# Patient Record
Sex: Male | Born: 1965
Health system: Southern US, Community
[De-identification: ages and names within clinical notes are randomized; demographics above are authoritative.]

## PROBLEM LIST (undated history)

## (undated) DIAGNOSIS — E7800 Pure hypercholesterolemia, unspecified: Secondary | ICD-10-CM

## (undated) DIAGNOSIS — N201 Calculus of ureter: Secondary | ICD-10-CM

## (undated) HISTORY — DX: Gilbert syndrome: E80.4

## (undated) HISTORY — PX: EYE SURGERY: SHX253

## (undated) HISTORY — DX: Calculus of ureter: N20.1

---

## 2002-01-07 ENCOUNTER — Emergency Department (HOSPITAL_COMMUNITY): Admission: EM | Admit: 2002-01-07 | Discharge: 2002-01-07 | Payer: Self-pay | Admitting: Emergency Medicine

## 2003-04-16 ENCOUNTER — Emergency Department (HOSPITAL_COMMUNITY): Admission: EM | Admit: 2003-04-16 | Discharge: 2003-04-16 | Payer: Self-pay | Admitting: Internal Medicine

## 2003-04-16 ENCOUNTER — Encounter: Payer: Self-pay | Admitting: Internal Medicine

## 2004-09-03 ENCOUNTER — Ambulatory Visit (HOSPITAL_COMMUNITY): Admission: RE | Admit: 2004-09-03 | Discharge: 2004-09-03 | Payer: Self-pay | Admitting: Pulmonary Disease

## 2004-09-24 ENCOUNTER — Ambulatory Visit: Payer: Self-pay | Admitting: Internal Medicine

## 2004-09-25 ENCOUNTER — Ambulatory Visit (HOSPITAL_COMMUNITY): Admission: RE | Admit: 2004-09-25 | Discharge: 2004-09-25 | Payer: Self-pay | Admitting: Internal Medicine

## 2004-10-02 ENCOUNTER — Ambulatory Visit (HOSPITAL_COMMUNITY): Admission: RE | Admit: 2004-10-02 | Discharge: 2004-10-02 | Payer: Self-pay | Admitting: Internal Medicine

## 2004-10-29 ENCOUNTER — Ambulatory Visit: Payer: Self-pay | Admitting: Internal Medicine

## 2005-09-02 ENCOUNTER — Ambulatory Visit: Payer: Self-pay | Admitting: Pulmonary Disease

## 2005-09-05 ENCOUNTER — Encounter (HOSPITAL_COMMUNITY): Admission: RE | Admit: 2005-09-05 | Discharge: 2005-09-05 | Payer: Self-pay | Admitting: Pulmonary Disease

## 2009-03-13 ENCOUNTER — Ambulatory Visit (HOSPITAL_COMMUNITY): Admission: RE | Admit: 2009-03-13 | Discharge: 2009-03-13 | Payer: Self-pay | Admitting: Pulmonary Disease

## 2010-10-26 ENCOUNTER — Encounter: Payer: Self-pay | Admitting: Pulmonary Disease

## 2012-10-23 ENCOUNTER — Emergency Department (HOSPITAL_COMMUNITY)
Admission: EM | Admit: 2012-10-23 | Discharge: 2012-10-23 | Disposition: A | Discharge status: Home / Self Care | Payer: 59 | Attending: Emergency Medicine | Admitting: Emergency Medicine

## 2012-10-23 ENCOUNTER — Encounter (HOSPITAL_COMMUNITY): Payer: Self-pay | Admitting: *Deleted

## 2012-10-23 DIAGNOSIS — E7800 Pure hypercholesterolemia, unspecified: Secondary | ICD-10-CM | POA: Insufficient documentation

## 2012-10-23 DIAGNOSIS — T2220XA Burn of second degree of shoulder and upper limb, except wrist and hand, unspecified site, initial encounter: Secondary | ICD-10-CM | POA: Insufficient documentation

## 2012-10-23 DIAGNOSIS — Z79899 Other long term (current) drug therapy: Secondary | ICD-10-CM | POA: Insufficient documentation

## 2012-10-23 DIAGNOSIS — Z23 Encounter for immunization: Secondary | ICD-10-CM | POA: Insufficient documentation

## 2012-10-23 DIAGNOSIS — Y9389 Activity, other specified: Secondary | ICD-10-CM | POA: Insufficient documentation

## 2012-10-23 DIAGNOSIS — T23259A Burn of second degree of unspecified palm, initial encounter: Secondary | ICD-10-CM | POA: Insufficient documentation

## 2012-10-23 DIAGNOSIS — X010XXA Exposure to flames in uncontrolled fire, not in building or structure, initial encounter: Secondary | ICD-10-CM | POA: Insufficient documentation

## 2012-10-23 DIAGNOSIS — T300 Burn of unspecified body region, unspecified degree: Secondary | ICD-10-CM

## 2012-10-23 DIAGNOSIS — T2121XA Burn of second degree of chest wall, initial encounter: Secondary | ICD-10-CM | POA: Insufficient documentation

## 2012-10-23 DIAGNOSIS — Y9289 Other specified places as the place of occurrence of the external cause: Secondary | ICD-10-CM | POA: Insufficient documentation

## 2012-10-23 DIAGNOSIS — X088XXA Exposure to other specified smoke, fire and flames, initial encounter: Secondary | ICD-10-CM | POA: Insufficient documentation

## 2012-10-23 HISTORY — DX: Pure hypercholesterolemia, unspecified: E78.00

## 2012-10-23 MED ORDER — OXYCODONE-ACETAMINOPHEN 5-325 MG PO TABS: 2.0000 | ORAL_TABLET | ORAL | Status: DC | PRN

## 2012-10-23 MED ORDER — HYDROMORPHONE HCL PF 1 MG/ML IJ SOLN
1.0000 mg | Freq: Once | INTRAMUSCULAR | Status: AC
  Administered 2012-10-23: 1 mg via INTRAVENOUS
  Filled 2012-10-23: qty 1

## 2012-10-23 MED ORDER — SILVER SULFADIAZINE 1 % EX CREA
TOPICAL_CREAM | Freq: Once | CUTANEOUS | Status: AC
  Administered 2012-10-23: 12:00:00 via TOPICAL

## 2012-10-23 MED ORDER — SILVER SULFADIAZINE 1 % EX CREA
TOPICAL_CREAM | CUTANEOUS | Status: AC
  Filled 2012-10-23: qty 100

## 2012-10-23 MED ORDER — TETANUS-DIPHTH-ACELL PERTUSSIS 5-2.5-18.5 LF-MCG/0.5 IM SUSP
0.5000 mL | Freq: Once | INTRAMUSCULAR | Status: AC
  Administered 2012-10-23: 0.5 mL via INTRAMUSCULAR
  Filled 2012-10-23: qty 0.5

## 2012-10-23 MED ORDER — SODIUM CHLORIDE 0.9 % IV SOLN
Freq: Once | INTRAVENOUS | Status: AC
  Administered 2012-10-23: 10:00:00 via INTRAVENOUS

## 2012-10-23 MED ORDER — SODIUM CHLORIDE 0.9 % IV BOLUS (SEPSIS)
1000.0000 mL | Freq: Once | INTRAVENOUS | Status: AC
  Administered 2012-10-23: 1000 mL via INTRAVENOUS

## 2012-10-23 MED ORDER — IBUPROFEN 600 MG PO TABS: 600.0000 mg | ORAL_TABLET | Freq: Four times a day (QID) | ORAL | Status: DC | PRN
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
## 2012-11-09 DIAGNOSIS — M792 Neuralgia and neuritis, unspecified: Secondary | ICD-10-CM | POA: Insufficient documentation

## 2012-11-09 DIAGNOSIS — L299 Pruritus, unspecified: Secondary | ICD-10-CM | POA: Insufficient documentation

## 2012-11-09 DIAGNOSIS — R52 Pain, unspecified: Secondary | ICD-10-CM | POA: Insufficient documentation

## 2012-11-27 ENCOUNTER — Encounter: Payer: Self-pay | Admitting: Family Medicine

## 2012-11-27 DIAGNOSIS — E785 Hyperlipidemia, unspecified: Secondary | ICD-10-CM | POA: Insufficient documentation

## 2013-01-03 ENCOUNTER — Encounter: Payer: Self-pay | Admitting: Physician Assistant

## 2013-01-03 ENCOUNTER — Ambulatory Visit (INDEPENDENT_AMBULATORY_CARE_PROVIDER_SITE_OTHER): Payer: 59 | Admitting: Physician Assistant

## 2013-01-03 VITALS — BP 126/84 | HR 56 | Temp 97.6°F | Resp 16 | Ht 71.0 in | Wt 151.0 lb

## 2013-01-03 DIAGNOSIS — E785 Hyperlipidemia, unspecified: Secondary | ICD-10-CM

## 2013-01-03 NOTE — Progress Notes (Signed)
   Patient ID: Devin Gross MRN: [id number], DOB: 07-16-1966, 47 y.o. Date of Encounter: 01/03/2013, 3:12 PM    Chief Complaint:  Chief Complaint  Patient presents with  . check cholesterol and PSA     HPI: 47 y.o. year old male here to f/u hyperlipidemia. He has a positive family h/o CAD. His father had CABG @ Age 22. His first OV here was 4/13.  Prior to coming here, was on Lipitor 10mg .  When came here 4/13, had FLP- LDL was 181- Lipitor was increased to 40mg . He has had f/u labs since then. Is still on Lipitor 40mg . He is taking this daily. Has no myalgias; no other adverse effects.  No complaints today. Has fasted all day!     Home Meds: Current Outpatient Prescriptions on File Prior to Visit  Medication Sig Dispense Refill  . atorvastatin (LIPITOR) 40 MG tablet Take 40 mg by mouth daily.      .       .            Allergies: No Known Allergies    Review of Systems: Constitutional: negative for chills, fever, night sweats, weight changes, or fatigue  HEENT: negative for vision changes, hearing loss, congestion, rhinorrhea, ST, epistaxis, or sinus pressure Cardiovascular: negative for chest pain or palpitations Respiratory: negative for hemoptysis, wheezing, shortness of breath, or cough Abdominal: negative for abdominal pain, nausea, vomiting, diarrhea, or constipation Dermatological: negative for rash Neurologic: negative for headache, dizziness, or syncope    Physical Exam: Blood pressure 126/84, pulse 56, temperature 97.6 F (36.4 C), temperature source Oral, resp. rate 16, height 5\' 11"  (1.803 m), weight 151 lb (68.493 kg)., Body mass index is 21.07 kg/(m^2). General: Well developed, well nourished,AAM. in no acute distress. Neck: Supple. No thyromegaly. Full ROM. No lymphadenopathy. Lungs: Clear bilaterally to auscultation without wheezes, rales, or rhonchi. Breathing is unlabored. Heart: RRR with S1 S2. No murmurs, rubs, or gallops  appreciated. Abdomen: Soft, non-tender, non-distended with normoactive bowel sounds. No hepatomegaly. No rebound/guarding. No obvious abdominal masses. Msk:  Strength and tone normal for age. Extremities/Skin: Warm and dry. No clubbing or cyanosis. No edema. No rashes or suspicious lesions. Neuro: Alert and oriented X 3. Moves all extremities spontaneously. Gait is normal. CNII-XII grossly in tact. Psych:  Responds to questions appropriately with a normal affect.     ASSESSMENT AND PLAN:  47 y.o. year old male with  1. Hyperlipidemia On Lipitor 40mg  QD. Check labs then adjust med if needed. - Lipid panel - COMPLETE METABOLIC PANEL WITH GFR  2. Gilbert's syndrome  3. Premature Family H/O CAD--Father with CABG at age 7 y/o.  4. Screening PSA- Discussed that insurance will only pay for this once q 12 mos. Last check 01/19/12. Will repeat p 01/18/13. He had CPE 01/19/12-screening labs and exam were normal.   Signed, [address] [place], [place], [facility] 01/03/2013 3:12 PM

## 2013-01-04 LAB — COMPLETE METABOLIC PANEL WITH GFR
AST: 19 U/L (ref 0–37)
Albumin: 4.5 g/dL (ref 3.5–5.2)
Alkaline Phosphatase: 57 U/L (ref 39–117)
BUN: 12 mg/dL (ref 6–23)
GFR, Est African American: >89 mL/min
GFR, Est Non African American: >89 mL/min
Glucose, Bld: 80 mg/dL (ref 70–99)
Potassium: 4.3 mEq/L (ref 3.5–5.3)
Sodium: 142 mEq/L (ref 135–145)
Total Bilirubin: 2.2 mg/dL — ABNORMAL HIGH (ref 0.3–1.2)
Total Protein: 7 g/dL (ref 6.0–8.3)

## 2013-01-04 LAB — LIPID PANEL
Cholesterol: 136 mg/dL (ref 0–200)
HDL: 52 mg/dL (ref >39)
LDL Cholesterol: 71 mg/dL (ref 0–99)
Total CHOL/HDL Ratio: 2.6 Ratio
Triglycerides: 64 mg/dL (ref <150)
VLDL: 13 mg/dL (ref 0–40)

## 2013-01-05 ENCOUNTER — Telehealth: Payer: Self-pay | Admitting: Family Medicine

## 2013-01-05 NOTE — Telephone Encounter (Signed)
Called pt.  Left mess all labs excellent.  Continue current medications and follow up again in 

## 2013-01-05 NOTE — Telephone Encounter (Signed)
Message copied by Donne Anon on Wed Jan 05, 2013  9:24 AM ------      Message from: Allayne Butcher      Created: Tue Jan 04, 2013 10:14 AM       Cholesterol numbers are excellent.   Liver numbers normal . . Continue current medication. Recheck 6 months ------

## 2013-02-08 DIAGNOSIS — L905 Scar conditions and fibrosis of skin: Secondary | ICD-10-CM | POA: Insufficient documentation

## 2013-04-24 ENCOUNTER — Other Ambulatory Visit: Payer: Self-pay | Admitting: Physician Assistant

## 2013-04-25 NOTE — Telephone Encounter (Signed)
Med refilled.

## 2013-07-19 ENCOUNTER — Emergency Department (HOSPITAL_COMMUNITY)
Admission: EM | Admit: 2013-07-19 | Discharge: 2013-07-20 | Disposition: A | Discharge status: Home / Self Care | Payer: 59 | Attending: Emergency Medicine | Admitting: Emergency Medicine

## 2013-07-19 ENCOUNTER — Encounter (HOSPITAL_COMMUNITY): Payer: Self-pay | Admitting: Emergency Medicine

## 2013-07-19 DIAGNOSIS — E7800 Pure hypercholesterolemia, unspecified: Secondary | ICD-10-CM | POA: Insufficient documentation

## 2013-07-19 DIAGNOSIS — K5900 Constipation, unspecified: Secondary | ICD-10-CM | POA: Insufficient documentation

## 2013-07-19 DIAGNOSIS — N200 Calculus of kidney: Secondary | ICD-10-CM

## 2013-07-19 LAB — URINALYSIS, ROUTINE W REFLEX MICROSCOPIC
Bilirubin Urine: NEGATIVE
Glucose, UA: NEGATIVE mg/dL
Leukocytes, UA: NEGATIVE
Nitrite: NEGATIVE
Specific Gravity, Urine: 1.02 (ref 1.005–1.030)
pH: 7 (ref 5.0–8.0)

## 2013-07-19 LAB — URINE MICROSCOPIC-ADD ON

## 2013-07-19 MED ORDER — HYDROMORPHONE HCL PF 1 MG/ML IJ SOLN
0.5000 mg | Freq: Once | INTRAMUSCULAR | Status: AC
  Administered 2013-07-20: 0.5 mg via INTRAVENOUS
  Filled 2013-07-19: qty 1

## 2013-07-19 MED ORDER — ONDANSETRON HCL 4 MG/2ML IJ SOLN
4.0000 mg | Freq: Once | INTRAMUSCULAR | Status: AC
  Administered 2013-07-20: 4 mg via INTRAVENOUS
  Filled 2013-07-19: qty 2

## 2013-07-19 NOTE — ED Provider Notes (Signed)
CSN: [id number]     Arrival date & time 07/19/13  2257 History   First MD Initiated Contact with Patient 07/19/13 2259     Chief Complaint  Patient presents with  . Abdominal Pain   (Consider location/radiation/quality/duration/timing/severity/associated sxs/prior Treatment) HPI Comments: Pt ia a 47 y/o male who present to ED with c/o constipation and changes in the urine. Pt states for 3 weeks he has been having changes in the stool. He reports the stool is brown but not black. No reported fever. No injury. He reports pain to the right side and going across to the left side. He had one episode of vomiting just before coming to the ED.  Pt also reports his urine is pink  To red in color. No c/o pain in the penis or testicles. No back pain.   Patient is a 47 y.o. male presenting with abdominal pain. The history is provided by the patient.  Abdominal Pain Associated symptoms: constipation   Associated symptoms: no chest pain, no cough, no dysuria, no hematuria and no shortness of breath     Past Medical History  Diagnosis Date  . Hypercholesteremia   . Gilbert's syndrome    Past Surgical History  Procedure Laterality Date  . Eye surgery     Family History  Problem Relation Age of Onset  . Heart disease Father 48    CABG no prior hx  . Hypertension Brother   . Hypertension Maternal Grandmother    History  Substance Use Topics  . Smoking status: Never Smoker   . Smokeless tobacco: Never Used  . Alcohol Use: No    Review of Systems  Constitutional: Negative for activity change.       All ROS Neg except as noted in HPI  HENT: Negative for nosebleeds.   Eyes: Negative for photophobia and discharge.  Respiratory: Negative for cough, shortness of breath and wheezing.   Cardiovascular: Negative for chest pain and palpitations.  Gastrointestinal: Positive for abdominal pain and constipation. Negative for blood in stool.  Genitourinary: Positive for flank pain. Negative for  dysuria, frequency and hematuria.  Musculoskeletal: Negative for arthralgias, back pain and neck pain.  Skin: Negative.   Neurological: Negative for dizziness, seizures and speech difficulty.  Psychiatric/Behavioral: Negative for hallucinations and confusion.    Allergies  Review of patient's allergies indicates no known allergies.  Home Medications   Current Outpatient Rx  Name  Route  Sig  Dispense  Refill  . atorvastatin (LIPITOR) 40 MG tablet      TAKE 1 TABLET BY MOUTH AT BEDTIME.   30 tablet   2     Dispense as written.   Marland Kitchen ibuprofen (ADVIL,MOTRIN) 600 MG tablet   Oral   Take 1 tablet (600 mg total) by mouth every 6 (six) hours as needed for pain.   30 tablet   0   . oxyCODONE-acetaminophen (PERCOCET/ROXICET) 5-325 MG per tablet   Oral   Take 2 tablets by mouth every 4 (four) hours as needed for pain.   15 tablet   0    BP 141/81  Pulse 56  Temp(Src) 98.4 F (36.9 C) (Oral)  Resp 20  Ht 6\' 1"  (1.854 m)  Wt 150 lb (68.04 kg)  BMI 19.79 kg/m2  SpO2 100% Physical Exam  Nursing note and vitals reviewed. Constitutional: He is oriented to person, place, and time. He appears well-developed and well-nourished.  Non-toxic appearance.  HENT:  Head: Normocephalic.  Right Ear: Tympanic membrane and external ear  normal.  Left Ear: Tympanic membrane and external ear normal.  Eyes: EOM and lids are normal. Pupils are equal, round, and reactive to light.  Neck: Normal range of motion. Neck supple. Carotid bruit is not present.  Cardiovascular: Normal rate, regular rhythm, normal heart sounds, intact distal pulses and normal pulses.   Pulmonary/Chest: Breath sounds normal. No respiratory distress.  Abdominal: Soft. Bowel sounds are normal. There is no tenderness. There is no guarding.  Genitourinary: Rectum normal. Guaiac negative stool.  Musculoskeletal: Normal range of motion.  Lymphadenopathy:       Head (right side): No submandibular adenopathy present.       Head  (left side): No submandibular adenopathy present.    He has no cervical adenopathy.  Neurological: He is alert and oriented to person, place, and time. He has normal strength. No cranial nerve deficit or sensory deficit.  Skin: Skin is warm and dry.  Psychiatric: He has a normal mood and affect. His speech is normal.    ED Course  Procedures (including critical care time) Labs Review Labs Reviewed  URINALYSIS, ROUTINE W REFLEX MICROSCOPIC   Imaging Review No results found.  EKG Interpretation   None       MDM  No diagnosis found. **I have reviewed nursing notes, vital signs, and all appropriate lab and imaging results for this patient.*  Vital signs are within normal limits with the exception of the pulse being 56. Pain much improved after intravenous Dilaudid.  The complete blood count is well within normal limits. The basic metabolic panel shows potassium to be low at 3.3 otherwise within normal limits. The hepatic function study shows the total bilirubin to be slightly elevated at 1.4 the indirect bilirubin to be elevated at 1.2. Lipase is normal at 38. Urinalysis shows a brown cloudy specimen with key tones present a large amount of hemoglobin and too numerous to count red blood cells being present. A CT scan of the abdomen and pelvis without contrast reveals moderate right hydronephrosis and hydroureter secondary to a 3 mm proximal ureteral stone present. There is also moderate food and fluid in the stomach. The prostate gland seems to be unremarkable. There is a moderate stool burden present.  Patient is advised to increase brain and in leafy green vegetables and water in his diet. He is advised to see Dr. Darrick Penna for additional evaluation of the moderate food and fluid in the stomach. The patient is to see [facility] concerning his kidney stones. He is advised to strain all urine and to have any stones that he pass evaluated by the urology specialist. Prescription  for diclofenac and Percocet given for pain. Patient is to return if any problems or concerns.  Kathie Dike, PA-C 07/20/13 [phone]

## 2013-07-19 NOTE — ED Notes (Signed)
Urine cherry red, sent to lab
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
## 2013-07-20 ENCOUNTER — Emergency Department (HOSPITAL_COMMUNITY): Payer: 59

## 2013-07-20 LAB — CBC WITH DIFFERENTIAL/PLATELET
Basophils Absolute: 0 10*3/uL (ref 0.0–0.1)
Eosinophils Absolute: 0.1 10*3/uL (ref 0.0–0.7)
Eosinophils Relative: 1 % (ref 0–5)
HCT: 41.9 % (ref 39.0–52.0)
Hemoglobin: 13.6 g/dL (ref 13.0–17.0)
Lymphocytes Relative: 20 % (ref 12–46)
Lymphs Abs: 1.7 10*3/uL (ref 0.7–4.0)
MCH: 28.2 pg (ref 26.0–34.0)
MCHC: 32.5 g/dL (ref 30.0–36.0)
Monocytes Absolute: 0.6 10*3/uL (ref 0.1–1.0)
Monocytes Relative: 7 % (ref 3–12)
Neutro Abs: 6.4 10*3/uL (ref 1.7–7.7)
Neutrophils Relative %: 72 % (ref 43–77)
Platelets: 209 10*3/uL (ref 150–400)
RBC: 4.83 MIL/uL (ref 4.22–5.81)
RDW: 13.7 % (ref 11.5–15.5)
WBC: 8.7 10*3/uL (ref 4.0–10.5)

## 2013-07-20 LAB — BASIC METABOLIC PANEL
BUN: 10 mg/dL (ref 6–23)
CO2: 30 mEq/L (ref 19–32)
Calcium: 9.6 mg/dL (ref 8.4–10.5)
Chloride: 103 mEq/L (ref 96–112)
Creatinine, Ser: 1.04 mg/dL (ref 0.50–1.35)
GFR calc Af Amer: >90 mL/min (ref >90)
GFR calc non Af Amer: 85 mL/min — ABNORMAL LOW (ref >90)
Glucose, Bld: 118 mg/dL — ABNORMAL HIGH (ref 70–99)
Potassium: 3.3 mEq/L — ABNORMAL LOW (ref 3.5–5.1)
Sodium: 143 mEq/L (ref 135–145)

## 2013-07-20 LAB — HEPATIC FUNCTION PANEL
ALT: 24 U/L (ref 0–53)
AST: 20 U/L (ref 0–37)
Albumin: 4.5 g/dL (ref 3.5–5.2)
Alkaline Phosphatase: 61 U/L (ref 39–117)
Bilirubin, Direct: 0.2 mg/dL (ref 0.0–0.3)
Indirect Bilirubin: 1.2 mg/dL — ABNORMAL HIGH (ref 0.3–0.9)
Total Bilirubin: 1.4 mg/dL — ABNORMAL HIGH (ref 0.3–1.2)
Total Protein: 7.5 g/dL (ref 6.0–8.3)

## 2013-07-20 MED ORDER — ACETAMINOPHEN 500 MG PO TABS: 1000.0000 mg | ORAL_TABLET | Freq: Once | ORAL | Status: DC

## 2013-07-20 MED ORDER — OXYCODONE-ACETAMINOPHEN 5-325 MG PO TABS
1.0000 | ORAL_TABLET | ORAL | Status: DC | PRN
Start: 2013-07-20 — End: 2013-11-10

## 2013-07-20 MED ORDER — DICLOFENAC SODIUM 75 MG PO TBEC: 75.0000 mg | DELAYED_RELEASE_TABLET | Freq: Two times a day (BID) | ORAL | Status: DC

## 2013-07-20 MED ORDER — SODIUM CHLORIDE 0.9 % IV BOLUS (SEPSIS): 1000.0000 mL | Freq: Once | INTRAVENOUS | Status: DC

## 2013-07-20 MED ORDER — POTASSIUM CHLORIDE CRYS ER 20 MEQ PO TBCR
40.0000 meq | EXTENDED_RELEASE_TABLET | Freq: Once | ORAL | Status: AC
  Administered 2013-07-20: 40 meq via ORAL
  Filled 2013-07-20: qty 2

## 2013-07-20 MED ORDER — MAGNESIUM HYDROXIDE 400 MG/5ML PO SUSP
30.0000 mL | Freq: Once | ORAL | Status: AC
  Administered 2013-07-20: 30 mL via ORAL
  Filled 2013-07-20: qty 30

## 2013-07-20 MED ORDER — OXYCODONE-ACETAMINOPHEN 5-325 MG PO TABS: 1.0000 | ORAL_TABLET | Freq: Four times a day (QID) | ORAL | Status: DC | PRN

## 2013-07-20 NOTE — ED Provider Notes (Signed)
Medical screening examination/treatment/procedure(s) were performed by non-physician practitioner and as supervising physician I was immediately available for consultation/collaboration.  Ashling Roane, MD 07/20/13 2117 

## 2013-07-27 MED FILL — Oxycodone w/ Acetaminophen Tab 5-325 MG: ORAL | Qty: 6 | Status: AC

## 2013-08-24 ENCOUNTER — Ambulatory Visit: Payer: 59 | Admitting: Physician Assistant

## 2013-08-31 ENCOUNTER — Encounter: Payer: Self-pay | Admitting: Family Medicine

## 2013-08-31 DIAGNOSIS — N201 Calculus of ureter: Secondary | ICD-10-CM | POA: Insufficient documentation

## 2013-08-31 HISTORY — DX: Calculus of ureter: N20.1

## 2013-09-25 ENCOUNTER — Other Ambulatory Visit: Payer: Self-pay | Admitting: Physician Assistant

## 2013-11-04 ENCOUNTER — Other Ambulatory Visit: Payer: BC Managed Care – PPO

## 2013-11-04 DIAGNOSIS — E785 Hyperlipidemia, unspecified: Secondary | ICD-10-CM

## 2013-11-04 DIAGNOSIS — Z0000 Encounter for general adult medical examination without abnormal findings: Secondary | ICD-10-CM

## 2013-11-04 DIAGNOSIS — Z79899 Other long term (current) drug therapy: Secondary | ICD-10-CM

## 2013-11-04 LAB — CBC WITH DIFFERENTIAL/PLATELET
BASOS PCT: 0 % (ref 0–1)
Basophils Absolute: 0 10*3/uL (ref 0.0–0.1)
Eosinophils Absolute: 0 10*3/uL (ref 0.0–0.7)
Eosinophils Relative: 1 % (ref 0–5)
HEMATOCRIT: 38.9 % — AB (ref 39.0–52.0)
HEMOGLOBIN: 12.9 g/dL — AB (ref 13.0–17.0)
LYMPHS ABS: 1.4 10*3/uL (ref 0.7–4.0)
Lymphocytes Relative: 36 % (ref 12–46)
MCH: 27.2 pg (ref 26.0–34.0)
MCHC: 33.2 g/dL (ref 30.0–36.0)
MCV: 82.1 fL (ref 78.0–100.0)
MONO ABS: 0.4 10*3/uL (ref 0.1–1.0)
MONOS PCT: 9 % (ref 3–12)
NEUTROS PCT: 54 % (ref 43–77)
Neutro Abs: 2.1 10*3/uL (ref 1.7–7.7)
Platelets: 218 10*3/uL (ref 150–400)
RBC: 4.74 MIL/uL (ref 4.22–5.81)
RDW: 13.8 % (ref 11.5–15.5)
WBC: 3.9 10*3/uL — ABNORMAL LOW (ref 4.0–10.5)

## 2013-11-04 LAB — COMPLETE METABOLIC PANEL WITH GFR
ALK PHOS: 54 U/L (ref 39–117)
ALT: 17 U/L (ref 0–53)
AST: 20 U/L (ref 0–37)
Albumin: 4.6 g/dL (ref 3.5–5.2)
BUN: 9 mg/dL (ref 6–23)
CHLORIDE: 105 meq/L (ref 96–112)
CO2: 30 mEq/L (ref 19–32)
CREATININE: 0.79 mg/dL (ref 0.50–1.35)
Calcium: 9.1 mg/dL (ref 8.4–10.5)
GFR, Est African American: >89 mL/min
GFR, Est Non African American: >89 mL/min
Glucose, Bld: 77 mg/dL (ref 70–99)
Potassium: 3.7 mEq/L (ref 3.5–5.3)
Sodium: 142 mEq/L (ref 135–145)
Total Bilirubin: 2.3 mg/dL — ABNORMAL HIGH (ref 0.2–1.2)
Total Protein: 7 g/dL (ref 6.0–8.3)

## 2013-11-04 LAB — LIPID PANEL
CHOL/HDL RATIO: 2.5 ratio
CHOLESTEROL: 140 mg/dL (ref 0–200)
HDL: 57 mg/dL (ref >39)
LDL CALC: 71 mg/dL (ref 0–99)
Triglycerides: 59 mg/dL (ref <150)
VLDL: 12 mg/dL (ref 0–40)

## 2013-11-10 ENCOUNTER — Encounter: Payer: Self-pay | Admitting: Physician Assistant

## 2013-11-10 ENCOUNTER — Ambulatory Visit (INDEPENDENT_AMBULATORY_CARE_PROVIDER_SITE_OTHER): Payer: BC Managed Care – PPO | Admitting: Physician Assistant

## 2013-11-10 DIAGNOSIS — Z8249 Family history of ischemic heart disease and other diseases of the circulatory system: Secondary | ICD-10-CM

## 2013-11-10 DIAGNOSIS — E785 Hyperlipidemia, unspecified: Secondary | ICD-10-CM

## 2013-11-10 MED ORDER — ATORVASTATIN CALCIUM 20 MG PO TABS: 20.0000 mg | ORAL_TABLET | Freq: Every day | ORAL | Status: DC

## 2013-11-11 NOTE — Progress Notes (Signed)
Patient ID: Devin Gross MRN: [id number], DOB: [birth year], 48 y.o. Date of Encounter: 11/11/2013, 1:16 PM    Chief Complaint:  Chief Complaint  Patient presents with  . post lab follow up     HPI: 48 y.o. year old [place] male here for routine followup office visit is hyperlipidemia. He has a family history of premature CAD. Father had CABG at age 64. He has no history of hypertension and has never smoked. However he does have hyperlipidemia which has been managed with medications to reduce his risk of CAD.  He is currently taking Lipitor 40 mg. He is having no myalgias or other adverse effects. However, he is asking today about possibly reducing the dose. He says he has heard these medications can cause problems, including to the liver. Feels that 40 mg is a significant dose and is wondering about reducing this.  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 As the evening with this physical exertion he has no chest pressure heaviness tightness and no increased shortness of breath or dyspnea on exertion.  Reports that he lived in [place] a while. Then in 2010/12/31 his father passed away he  he moved to [place] and has been living with his mom there since. Lives with his mom and it is just the 2 of them.     Home Meds: See attached medication section for any medications that were entered at today's visit. The computer does not put those onto this list.The following list is a list of meds entered prior to today's visit.  He is on Lipitor 40 mg 1 daily Current Outpatient Prescriptions on File Prior to Visit  Medication Sig Dispense Refill  . ibuprofen (ADVIL,MOTRIN) 600 MG tablet Take 1 tablet (600 mg total) by mouth every 6 (six) hours as needed for pain.  30 tablet  0   No current facility-administered medications on file prior to visit.    Allergies: No Known Allergies    Review of Systems: See HPI for pertinent ROS. All other ROS negative.    Physical  Exam: Blood pressure 124/82, pulse 56, temperature 98.1 F (36.7 C), temperature source Oral, resp. rate 18, height 6' (1.829 m), weight 152 lb (68.947 kg)., Body mass index is 20.61 kg/(m^2). General: NWD AAM. Appears in no acute distress. Neck: Supple. No thyromegaly. No lymphadenopathy. No carotid bruits. Lungs: Clear bilaterally to auscultation without wheezes, rales, or rhonchi. Breathing is unlabored. Heart: Regular rhythm. No murmurs, rubs, or gallops. Abdomen: Soft, non-tender, non-distended with normoactive bowel sounds. No hepatomegaly. No rebound/guarding. No obvious abdominal masses. Msk:  Strength and tone normal for age. Extremities/Skin: Warm and dry. Neuro: Alert and oriented X 3. Moves all extremities spontaneously. Gait is normal. CNII-XII grossly in tact. Psych:  Responds to questions appropriately with a normal affect.   Results for orders placed in visit on 11/04/13  CBC WITH DIFFERENTIAL      Result Value Range   WBC 3.9 (*) 4.0 - 10.5 K/uL   RBC 4.74  4.22 - 5.81 MIL/uL   Hemoglobin 12.9 (*) 13.0 - 17.0 g/dL   HCT 38.9 (*) 39.0 - 52.0 %   MCV 82.1  78.0 - 100.0 fL   MCH 27.2  26.0 - 34.0 pg   MCHC 33.2  30.0 - 36.0 g/dL   RDW 13.8  11.5 - 15.5 %   Platelets 218  150 - 400 K/uL   Neutrophils Relative % 54  43 - 77 %   Neutro  Abs 2.1  1.7 - 7.7 K/uL   Lymphocytes Relative 36  12 - 46 %   Lymphs Abs 1.4  0.7 - 4.0 K/uL   Monocytes Relative 9  3 - 12 %   Monocytes Absolute 0.4  0.1 - 1.0 K/uL   Eosinophils Relative 1  0 - 5 %   Eosinophils Absolute 0.0  0.0 - 0.7 K/uL   Basophils Relative 0  0 - 1 %   Basophils Absolute 0.0  0.0 - 0.1 K/uL   Smear Review Criteria for review not met    LIPID PANEL      Result Value Range   Cholesterol 140  0 - 200 mg/dL   Triglycerides 59  <150 mg/dL   HDL 57  >39 mg/dL   Total CHOL/HDL Ratio 2.5     VLDL 12  0 - 40 mg/dL   LDL Cholesterol 71  0 - 99 mg/dL  COMPLETE METABOLIC PANEL WITH GFR      Result Value Range    Sodium 142  135 - 145 mEq/L   Potassium 3.7  3.5 - 5.3 mEq/L   Chloride 105  96 - 112 mEq/L   CO2 30  19 - 32 mEq/L   Glucose, Bld 77  70 - 99 mg/dL   BUN 9  6 - 23 mg/dL   Creat 0.79  0.50 - 1.35 mg/dL   Total Bilirubin 2.3 (*) 0.2 - 1.2 mg/dL   Alkaline Phosphatase 54  39 - 117 U/L   AST 20  0 - 37 U/L   ALT 17  0 - 53 U/L   Total Protein 7.0  6.0 - 8.3 g/dL   Albumin 4.6  3.5 - 5.2 g/dL   Calcium 9.1  8.4 - 10.5 mg/dL   GFR, Est African American >89     GFR, Est Non African American >89       ASSESSMENT AND PLAN:  48 y.o. year old male with  1. Hyperlipidemia Lipid panel is excellent as above. Last labs prior to this for 10 months ago and the numbers were almost exactly the same. Both times LDL was 71. I agree that we can decrease his Lipitor dose down from 40 mg down to 20 mg. Will need followup office visit and fasting labs in 6 months. - atorvastatin (LIPITOR) 20 MG tablet; Take 1 tablet (20 mg total) by mouth daily.  Dispense: 90 tablet; Refill: 1  2. Family history of premature coronary artery disease Father with CABG at age 42--first diagnosed CAD.  3. Gilbert's syndrome    Signed, [address] [place], [place], [facility] 11/11/2013 1:16 PM

## 2014-05-10 ENCOUNTER — Encounter: Payer: Self-pay | Admitting: Physician Assistant

## 2014-05-10 ENCOUNTER — Ambulatory Visit (INDEPENDENT_AMBULATORY_CARE_PROVIDER_SITE_OTHER): Payer: BC Managed Care – PPO | Admitting: Physician Assistant

## 2014-05-10 VITALS — BP 104/70 | HR 64 | Temp 98.0°F | Resp 14 | Ht 73.0 in | Wt 158.0 lb

## 2014-05-10 DIAGNOSIS — Z8249 Family history of ischemic heart disease and other diseases of the circulatory system: Secondary | ICD-10-CM

## 2014-05-10 DIAGNOSIS — E785 Hyperlipidemia, unspecified: Secondary | ICD-10-CM

## 2014-05-10 DIAGNOSIS — B354 Tinea corporis: Secondary | ICD-10-CM

## 2014-05-10 NOTE — Progress Notes (Signed)
Patient ID: Devin Gross MRN: [id number], DOB: 1966-06-25, 48 y.o. Date of Encounter: 05/10/2014, 4:30 PM    Chief Complaint:  Chief Complaint  Patient presents with  . Med check     HPI: 48 y.o. year old AA male here for routine followup office visit is hyperlipidemia. He has a family history of premature CAD. Father had CABG at age 24. He has no history of hypertension and has never smoked. However he does have hyperlipidemia which has been managed with medications to reduce his risk of CAD.  He had been taking Lipitor 40mg . At his OV 11/10/2013, he asked about possibly reducing the dose. He said he has heard these medications can cause problems, including to the liver. Felt that 40 mg was a significant dose and was wondering about reducing this.  At that OV I decreased the dose from 40mg  to 20mg .   he is taking his Lipitor to 20 mg. He continues to have no myalgias or other adverse effects.  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 Says that even with this physical exertion he has no chest pressure heaviness tightness and no increased shortness of breath or dyspnea on exertion.  Reports that he lived in [place] a while. Then in 01-23-11 his father passed away he  he moved to [place] and has been living with his mom there since. Lives with his mom and it is just the 2 of them.  The only other thing he wanted to check on today is that he has been having a rash recently. Has noticed an area on his right chest and also behind his left ear.     Home Meds:  Outpatient Prescriptions Prior to Visit  Medication Sig Dispense Refill  . atorvastatin (LIPITOR) 20 MG tablet Take 1 tablet (20 mg total) by mouth daily.  90 tablet  1  . ibuprofen (ADVIL,MOTRIN) 600 MG tablet Take 1 tablet (600 mg total) by mouth every 6 (six) hours as needed for pain.  30 tablet  0   No facility-administered medications prior to visit.     Allergies: No Known Allergies     Review of Systems: See HPI for pertinent ROS. All other ROS negative.    Physical Exam: Blood pressure 104/70, pulse 64, temperature 98 F (36.7 C), temperature source Oral, resp. rate 14, height 6\' 1"  (1.854 m), weight 158 lb (71.668 kg)., Body mass index is 20.85 kg/(m^2). General: NWD AAM. Appears in no acute distress. Neck: Supple. No thyromegaly. No lymphadenopathy. No carotid bruits. Lungs: Clear bilaterally to auscultation without wheezes, rales, or rhonchi. Breathing is unlabored. Heart: Regular rhythm. No murmurs, rubs, or gallops. Abdomen: Soft, non-tender, non-distended with normoactive bowel sounds. No hepatomegaly. No rebound/guarding. No obvious abdominal masses. Msk:  Strength and tone normal for age. Extremities/Skin: Warm and dry. Right Chest: Round/circular rash, approx 2 inch diameter with raised pink/erythematous border.  Approx 2 cm diameter area of rash just behind the left ear. Neuro: Alert and oriented X 3. Moves all extremities spontaneously. Gait is normal. CNII-XII grossly in tact. Psych:  Responds to questions appropriately with a normal affect.      ASSESSMENT AND PLAN:  48 y.o. year old male with     Tinea corporis Told him to use OTC Lamisil or Lotrimin. Apply BID every day -- if not improved in 2 -3 weeks, f/u.  1. Hyperlipidemia - Hepatic function panel He is not fasting today. Will go ahead and check liver function tests  now. He says that he goes in to work very early prior to [employer]. Has no days off schedule anywhere in the near future. However, he says that if he has a day that it rains, then he will not be able to work--if this happens any time then he will come in and do fasting lipid panel at that time. Lipid panel was excellent at last check 11/04/2013. However at his office visit 11/10/13 we decreased his Lipitor from 40 mg to 20 mg. We have not rechecked lipids on decreased dose.   2. Family history of premature coronary artery  disease Father with CABG at age 45--first diagnosed CAD.  3. Gilbert's syndrome    Signed, [address] [place], [place], [facility] 05/10/2014 4:30 PM

## 2014-05-11 ENCOUNTER — Encounter: Payer: Self-pay | Admitting: *Deleted

## 2014-05-11 LAB — HEPATIC FUNCTION PANEL
ALK PHOS: 54 U/L (ref 39–117)
ALT: 15 U/L (ref 0–53)
AST: 17 U/L (ref 0–37)
Albumin: 4.9 g/dL (ref 3.5–5.2)
BILIRUBIN DIRECT: 0.3 mg/dL (ref 0.0–0.3)
BILIRUBIN INDIRECT: 1.7 mg/dL — AB (ref 0.2–1.2)
BILIRUBIN TOTAL: 2 mg/dL — AB (ref 0.2–1.2)
Total Protein: 7.1 g/dL (ref 6.0–8.3)

## 2014-07-03 ENCOUNTER — Encounter: Payer: Self-pay | Admitting: Family Medicine

## 2014-07-03 ENCOUNTER — Ambulatory Visit (INDEPENDENT_AMBULATORY_CARE_PROVIDER_SITE_OTHER): Payer: BC Managed Care – PPO | Admitting: Family Medicine

## 2014-07-03 VITALS — BP 118/68 | HR 60 | Temp 98.6°F | Resp 14 | Ht 73.0 in | Wt 164.0 lb

## 2014-07-03 DIAGNOSIS — J209 Acute bronchitis, unspecified: Secondary | ICD-10-CM

## 2014-07-03 MED ORDER — AZITHROMYCIN 250 MG PO TABS: ORAL_TABLET | ORAL | Status: DC

## 2014-07-03 NOTE — Progress Notes (Signed)
   Subjective:    Patient ID: Devin Gross, male    DOB: 04-14-1966, 48 y.o.   MRN: [id number]  HPI The patient has been sick for 2 weeks. It began as an upper respiratory infection with rhinorrhea cough and sore throat. However now the cough is worsened. He has chest congestion particularly worse on the left side she is unable to cough up. He denies any fevers but he does report shortness of breath and dyspnea on exertion. He denies any pleurisy or hemoptysis. On examination he has no visible Rales in the left lower lobe posteriorly. Otherwise his lungs are clear. No wheezes. Past Medical History  Diagnosis Date  . Hypercholesteremia   . Gilbert's syndrome   . Calculus of ureter 08/31/2013   Past Surgical History  Procedure Laterality Date  . Eye surgery     Current Outpatient Prescriptions on File Prior to Visit  Medication Sig Dispense Refill  . atorvastatin (LIPITOR) 20 MG tablet Take 1 tablet (20 mg total) by mouth daily.  90 tablet  1  . ibuprofen (ADVIL,MOTRIN) 600 MG tablet Take 1 tablet (600 mg total) by mouth every 6 (six) hours as needed for pain.  30 tablet  0   No current facility-administered medications on file prior to visit.   No Known Allergies History   Social History  . Marital Status: Single    Spouse Name: N/A    Number of Children: N/A  . Years of Education: N/A   Occupational History  . Not on file.   Social History Main Topics  . Smoking status: Never Smoker   . Smokeless tobacco: Never Used  . Alcohol Use: No  . Drug Use: No  . Sexual Activity: Not on file   Other Topics Concern  . Not on file   Social History Narrative  . No narrative on file      Review of Systems  All other systems reviewed and are negative.      Objective:   Physical Exam  Vitals reviewed. HENT:  Right Ear: External ear normal.  Left Ear: External ear normal.  Nose: Nose normal.  Mouth/Throat: Oropharynx is clear and moist. No oropharyngeal exudate.    Eyes: Conjunctivae are normal.  Neck: Neck supple.  Cardiovascular: Normal rate, regular rhythm and normal heart sounds.   No murmur heard. Pulmonary/Chest: Effort normal. He has rales.  Abdominal: Soft. Bowel sounds are normal.  Lymphadenopathy:    He has no cervical adenopathy.          Assessment & Plan:  Acute bronchitis, unspecified organism - Plan: azithromycin (ZITHROMAX) 250 MG tablet   The patient Z-Pak to treat secondary bronchitis. Also recommended Mucinex 100 mg every 4 hours. I recommended that he sleep next is humidifier to help break up the congestion make it easier to cough up. Recheck in 1 week if no better or sooner if worse.

## 2014-09-08 ENCOUNTER — Other Ambulatory Visit: Payer: Self-pay | Admitting: Physician Assistant

## 2014-09-11 NOTE — Telephone Encounter (Signed)
Medication refilled per protocol. 

## 2014-11-09 ENCOUNTER — Ambulatory Visit: Payer: BC Managed Care – PPO | Admitting: Physician Assistant

## 2014-11-23 ENCOUNTER — Encounter: Payer: Self-pay | Admitting: Physician Assistant

## 2014-11-23 ENCOUNTER — Encounter: Payer: Self-pay | Admitting: Family Medicine

## 2014-11-23 ENCOUNTER — Ambulatory Visit (INDEPENDENT_AMBULATORY_CARE_PROVIDER_SITE_OTHER): Payer: 59 | Admitting: Physician Assistant

## 2014-11-23 VITALS — BP 134/88 | HR 64 | Temp 97.8°F | Resp 18 | Wt 167.0 lb

## 2014-11-23 DIAGNOSIS — Z8249 Family history of ischemic heart disease and other diseases of the circulatory system: Secondary | ICD-10-CM

## 2014-11-23 DIAGNOSIS — E785 Hyperlipidemia, unspecified: Secondary | ICD-10-CM

## 2014-11-23 DIAGNOSIS — Z8042 Family history of malignant neoplasm of prostate: Secondary | ICD-10-CM

## 2014-11-23 NOTE — Progress Notes (Signed)
Patient ID: JAYR LUPERCIO MRN: [id number], DOB: [birth year], 49 y.o. Date of Encounter: 11/23/2014, 6:16 PM    Chief Complaint:  Chief Complaint  Patient presents with  . routine checkup    not fasting,  has labs from work PE and other questions  . Flu Vaccine     HPI: 49 y.o. year old AA male here for routine followup office visit is hyperlipidemia. He has a family history of premature CAD. Father had CABG at age 41. He has no history of hypertension and has never smoked. However he does have hyperlipidemia which has been managed with medications to reduce his risk of CAD.  He had been taking Lipitor 40mg .  At his OV 11/10/2013, he asked about possibly reducing the dose. Said that he started Lipitor in his 24s. Has been on it for over 20 years.  He said he has heard these medications can cause problems, including to the liver. Felt that 40 mg was a significant dose and was wondering about reducing this.  At that OV I decreased the dose from 40mg  to 20mg .   he is taking his Lipitor to 20 mg. He continues to have no myalgias or other adverse effects.  He was working with  a Armed forces technical officer doing asphalt, Scientist, water quality. At [place] 11/2014 reports that he has gotten a job with the city of [place]. As he is doing similar work but is more active now with shoveling and other things. 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 Says that now with his job he has Fridays off. Works 6:30 AM to 5:00 PM 4 days a week.  Says that even with this physical exertion he has no chest pressure heaviness tightness and no increased shortness of breath or dyspnea on exertion.  Reports that he lived in [place] a while. Then in 01-19-11 his father passed away he  he moved to [place] and has been living with his mom there since. Lives with his mom and it is just the 2 of them.  At office visit 11/2014 he says that he has multiple family members with prostate cancer. A cousin, 2 uncles,  and his father also had prostate cancer. Is wanting to check a screening test for this.  Home Meds:  Outpatient Prescriptions Prior to Visit  Medication Sig Dispense Refill  . atorvastatin (LIPITOR) 20 MG tablet TAKE 1 TABLET BY MOUTH AT BEDTIME. 90 tablet 0  . ibuprofen (ADVIL,MOTRIN) 600 MG tablet Take 1 tablet (600 mg total) by mouth every 6 (six) hours as needed for pain. 30 tablet 0  . azithromycin (ZITHROMAX) 250 MG tablet 2 tabs poqday1, 1 tab poqday 2-5 6 tablet 0  . guaiFENesin (MUCINEX) 600 MG 12 hr tablet Take 600 mg by mouth 2 (two) times daily.     No facility-administered medications prior to visit.     Allergies: No Known Allergies    Review of Systems: See HPI for pertinent ROS. All other ROS negative.    Physical Exam: Blood pressure 134/88, pulse 64, temperature 97.8 F (36.6 C), temperature source Oral, resp. rate 18, weight 167 lb (75.751 kg)., Body mass index is 22.04 kg/(m^2). General: NWD AAM. Appears in no acute distress. Neck: Supple. No thyromegaly. No lymphadenopathy. No carotid bruits. Lungs: Clear bilaterally to auscultation without wheezes, rales, or rhonchi. Breathing is unlabored. Heart: Regular rhythm. No murmurs, rubs, or gallops. Abdomen: Soft, non-tender, non-distended with normoactive bowel sounds. No hepatomegaly. No rebound/guarding. No obvious abdominal masses.  Msk:  Strength and tone normal for age. Extremities/Skin: Warm and dry. Neuro: Alert and oriented X 3. Moves all extremities spontaneously. Gait is normal. CNII-XII grossly in tact. Psych:  Responds to questions appropriately with a normal affect.      ASSESSMENT AND PLAN:  49 y.o. year old male with   1. Family history of premature coronary artery disease He says he can return tomorrow morning fasting for labs. - Hepatic function panel; Future - Lipid panel; Future  2. Hyperlipidemia Says he can return tomorrow morning fasting for labs. - Hepatic function panel; Future -  Lipid panel; Future  3. Gilbert's syndrome  4. Family history of prostate cancer - PSA; Future  5. Immunizations: Tdap--Given 10/23/2012 Flu Vaccine-- Pt agreeable to receive today   Signed, [facility] [place], [place], [facility] 11/23/2014 6:16 PM

## 2014-11-24 ENCOUNTER — Other Ambulatory Visit: Payer: 59

## 2014-11-24 DIAGNOSIS — Z8249 Family history of ischemic heart disease and other diseases of the circulatory system: Secondary | ICD-10-CM

## 2014-11-24 DIAGNOSIS — E785 Hyperlipidemia, unspecified: Secondary | ICD-10-CM

## 2014-11-24 DIAGNOSIS — Z8042 Family history of malignant neoplasm of prostate: Secondary | ICD-10-CM

## 2014-11-24 LAB — HEPATIC FUNCTION PANEL
ALK PHOS: 60 U/L (ref 39–117)
ALT: 29 U/L (ref 0–53)
AST: 24 U/L (ref 0–37)
Albumin: 4.6 g/dL (ref 3.5–5.2)
BILIRUBIN DIRECT: 0.4 mg/dL — AB (ref 0.0–0.3)
BILIRUBIN TOTAL: 2.2 mg/dL — AB (ref 0.2–1.2)
Indirect Bilirubin: 1.8 mg/dL — ABNORMAL HIGH (ref 0.2–1.2)
Total Protein: 6.8 g/dL (ref 6.0–8.3)

## 2014-11-24 LAB — LIPID PANEL
Cholesterol: 153 mg/dL (ref 0–200)
HDL: 59 mg/dL (ref >39)
LDL CALC: 81 mg/dL (ref 0–99)
Total CHOL/HDL Ratio: 2.6 Ratio
Triglycerides: 64 mg/dL (ref <150)
VLDL: 13 mg/dL (ref 0–40)

## 2014-11-25 LAB — PSA: PSA: 1.56 ng/mL (ref <=4.00)

## 2015-01-14 ENCOUNTER — Other Ambulatory Visit: Payer: Self-pay | Admitting: Physician Assistant

## 2015-01-15 NOTE — Telephone Encounter (Signed)
Medication refilled per protocol. 

## 2015-05-24 ENCOUNTER — Encounter: Payer: Self-pay | Admitting: Physician Assistant

## 2015-05-24 ENCOUNTER — Ambulatory Visit (INDEPENDENT_AMBULATORY_CARE_PROVIDER_SITE_OTHER): Payer: 59 | Admitting: Physician Assistant

## 2015-05-24 DIAGNOSIS — Z8249 Family history of ischemic heart disease and other diseases of the circulatory system: Secondary | ICD-10-CM

## 2015-05-24 DIAGNOSIS — E785 Hyperlipidemia, unspecified: Secondary | ICD-10-CM

## 2015-05-24 NOTE — Progress Notes (Signed)
Patient ID: Devin Gross MRN: [id number], DOB: 07-02-1966, 49 y.o. Date of Encounter: 05/24/2015, 4:45 PM    Chief Complaint:  Chief Complaint  Patient presents with  . Follow-up    hyperlipidemia     HPI: 49 y.o. year old AA male here for routine followup office visit is hyperlipidemia. He has a family history of premature CAD. Father had CABG at age 63. He has no history of hypertension and has never smoked. However he does have hyperlipidemia which has been managed with medications to reduce his risk of CAD.  He had been taking Lipitor 40mg .  At his OV 11/10/2013, he asked about possibly reducing the dose. Said that he started Lipitor in his 44s. Has been on it for over 20 years.  He said he has heard these medications can cause problems, including to the liver. Felt that 40 mg was a significant dose and was wondering about reducing this.  At that OV I decreased the dose from 40mg  to 20mg . He is taking his Lipitor to 20 mg. He continues to have no myalgias or other adverse effects.  He was working with  a Armed forces technical officer doing asphalt, Scientist, water quality. At [place] 11/2014 reports that he has gotten a job with the city of [place]. Says he is doing similar work but is more active now with shoveling and other things. 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 Says that now with his job he has Fridays off. Works 6:30 AM to 5:00 PM 4 days a week.  Says that even with this physical exertion he has no chest pressure heaviness tightness and no increased shortness of breath or dyspnea on exertion.  Reports that he lived in [place] a while. Then in 01/22/11 his father passed away, he moved to [place] and has been living with his mom there since. Lives with his mom and it is just the 2 of them.  At office visit 48/2016 he says that he has multiple family members with prostate cancer. A cousin, 2 uncles, and his father also had prostate cancer.  Checked PSA at that  time--normal.    Home Meds:  Outpatient Prescriptions Prior to Visit  Medication Sig Dispense Refill  . atorvastatin (LIPITOR) 20 MG tablet TAKE 1 TABLET BY MOUTH AT BEDTIME. 90 tablet 1  . ibuprofen (ADVIL,MOTRIN) 600 MG tablet Take 1 tablet (600 mg total) by mouth every 6 (six) hours as needed for pain. (Patient not taking: Reported on 05/24/2015) 30 tablet 0   No facility-administered medications prior to visit.     Allergies: No Known Allergies    Review of Systems: See HPI for pertinent ROS. All other ROS negative.    Physical Exam: Blood pressure 120/70, pulse 72, temperature 98.1 F (36.7 C), temperature source Oral, resp. rate 16, height 6\' 1"  (1.854 m), weight 160 lb (72.576 kg)., Body mass index is 21.11 kg/(m^2). General: NWD AAM. Appears in no acute distress. Neck: Supple. No thyromegaly. No lymphadenopathy. No carotid bruits. Lungs: Clear bilaterally to auscultation without wheezes, rales, or rhonchi. Breathing is unlabored. Heart: Regular rhythm. No murmurs, rubs, or gallops. Abdomen: Soft, non-tender, non-distended with normoactive bowel sounds. No hepatomegaly. No rebound/guarding. No obvious abdominal masses. Msk:  Strength and tone normal for age. Extremities/Skin: Warm and dry. Neuro: Alert and oriented X 3. Moves all extremities spontaneously. Gait is normal. CNII-XII grossly in tact. Psych:  Responds to questions appropriately with a normal affect.      ASSESSMENT AND  PLAN:  49 y.o. year old male with   1. Family history of premature coronary artery disease He says he has had a [employer] but otherwise has had no food and only water to drink all day. - Hepatic function panel - Lipid panel  2. Hyperlipidemia He says he has had a [employer]epsi but otherwise has had no food and only water to drink all day. - Hepatic function panel - Lipid panel  3. Gilbert's syndrome  4. Family history of prostate cancer - PSA--Normal 11/2014  5. Immunizations: Tdap--Given  10/23/2012 Flu Vaccine-- received here 11/2014  Routine follow-up office visit 6 months or sooner if needed.   [address] [place], [place], [facility] 05/24/2015 4:45 PM

## 2015-05-25 LAB — HEPATIC FUNCTION PANEL
ALBUMIN: 4.6 g/dL (ref 3.6–5.1)
ALK PHOS: 53 U/L (ref 40–115)
ALT: 15 U/L (ref 9–46)
AST: 14 U/L (ref 10–40)
BILIRUBIN TOTAL: 2.3 mg/dL — AB (ref 0.2–1.2)
Bilirubin, Direct: 0.4 mg/dL — ABNORMAL HIGH (ref <=0.2)
Indirect Bilirubin: 1.9 mg/dL — ABNORMAL HIGH (ref 0.2–1.2)
Total Protein: 6.8 g/dL (ref 6.1–8.1)

## 2015-05-25 LAB — LIPID PANEL
CHOL/HDL RATIO: 2.7 ratio (ref <=5.0)
Cholesterol: 162 mg/dL (ref 125–200)
HDL: 59 mg/dL (ref >=40)
LDL Cholesterol: 90 mg/dL (ref <130)
TRIGLYCERIDES: 66 mg/dL (ref <150)
VLDL: 13 mg/dL (ref <30)

## 2015-11-06 ENCOUNTER — Other Ambulatory Visit: Payer: Self-pay | Admitting: Physician Assistant

## 2015-11-07 NOTE — Telephone Encounter (Signed)
Medication refilled per protocol. 

## 2015-11-29 ENCOUNTER — Ambulatory Visit: Payer: 59 | Admitting: Physician Assistant

## 2016-03-09 ENCOUNTER — Other Ambulatory Visit: Payer: Self-pay | Admitting: Physician Assistant

## 2016-03-27 ENCOUNTER — Ambulatory Visit (INDEPENDENT_AMBULATORY_CARE_PROVIDER_SITE_OTHER): Payer: Commercial Managed Care - HMO | Admitting: Physician Assistant

## 2016-03-27 ENCOUNTER — Encounter: Payer: Self-pay | Admitting: Physician Assistant

## 2016-03-27 VITALS — BP 100/80 | HR 52 | Temp 98.1°F | Resp 16 | Ht 73.0 in | Wt 159.0 lb

## 2016-03-27 DIAGNOSIS — E785 Hyperlipidemia, unspecified: Secondary | ICD-10-CM | POA: Diagnosis not present

## 2016-03-27 DIAGNOSIS — I499 Cardiac arrhythmia, unspecified: Secondary | ICD-10-CM

## 2016-03-27 DIAGNOSIS — Z125 Encounter for screening for malignant neoplasm of prostate: Secondary | ICD-10-CM

## 2016-03-27 DIAGNOSIS — Z8249 Family history of ischemic heart disease and other diseases of the circulatory system: Secondary | ICD-10-CM

## 2016-03-27 NOTE — Progress Notes (Signed)
Patient ID: GANNON NICCUM MRN: [id number], DOB: 1966/01/20, 50 y.o. Date of Encounter: 03/27/2016, 3:54 PM    Chief Complaint:  Chief Complaint  Patient presents with  . OTHER    Med refills/ atorvastatin     HPI: 50 y.o. year old AA male here for routine followup office visit is hyperlipidemia. He has a family history of premature CAD. Father had CABG at age 54. He has no history of hypertension and has never smoked. However he does have hyperlipidemia which has been managed with medications to reduce his risk of CAD.  He had been taking Lipitor 40mg .  At his OV 11/10/2013, he asked about possibly reducing the dose. Said that he started Lipitor in his 22s. Has been on it for over 20 years.  He said he has heard these medications can cause problems, including to the liver. Felt that 40 mg was a significant dose and was wondering about reducing this.  At that OV I decreased the dose from 40mg  to 20mg . He is taking his Lipitor to 20 mg. He continues to have no myalgias or other adverse effects.  He was working with  a Armed forces technical officer doing asphalt, Scientist, water quality. At [place] 11/2014 reports that he has gotten a job with the city of [place]. Says he is doing similar work but is more active now with shoveling and other things. 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 Says that now with his job he has Fridays off. Works 6:30 AM to 5:00 PM 4 days a week.  Says that even with this physical exertion he has no chest pressure heaviness tightness and no increased shortness of breath or dyspnea on exertion.  Reports that he lived in [place] a while. Then in 01/28/11 his father passed away, he moved to [place] and has been living with his mom there since. Lives with his mom and it is just the 2 of them.  At office visit 11/2014 he says that he has multiple family members with prostate cancer. A cousin, 2 uncles, and his father also had prostate cancer.  Checked PSA at  that time--normal.    03/27/2016: Still working with [employer]. Still lives with his mom.  Taking Lipitor 20mg . No myalgias or other adv effects.  Wants to recheck PSA given family h/o prostate cancer.  No other complaint/concerns.  Even with exertion at work--no chest pressure, heaviness, tightness, squeezing, no increased SOB or DOE.    Home Meds:  Outpatient Prescriptions Prior to Visit  Medication Sig Dispense Refill  . atorvastatin (LIPITOR) 20 MG tablet TAKE 1 TABLET BY MOUTH AT BEDTIME. 90 tablet 0  . ibuprofen (ADVIL,MOTRIN) 600 MG tablet Take 1 tablet (600 mg total) by mouth every 6 (six) hours as needed for pain. (Patient not taking: Reported on 05/24/2015) 30 tablet 0   No facility-administered medications prior to visit.     Allergies: No Known Allergies    Review of Systems: See HPI for pertinent ROS. All other ROS negative.    Physical Exam: Blood pressure 100/80, pulse 52, temperature 98.1 F (36.7 C), temperature source Oral, resp. rate 16, height 6\' 1"  (1.854 m), weight 159 lb (72.122 kg)., Body mass index is 20.98 kg/(m^2). General: NWD AAM. Appears in no acute distress. Neck: Supple. No thyromegaly. No lymphadenopathy. No carotid bruits. Lungs: Clear bilaterally to auscultation without wheezes, rales, or rhonchi. Breathing is unlabored. Heart: Regular rhythm. No murmurs, rubs, or gallops. Abdomen: Soft, non-tender, non-distended with  normoactive bowel sounds. No hepatomegaly. No rebound/guarding. No obvious abdominal masses. Msk:  Strength and tone normal for age. Extremities/Skin: Warm and dry. Neuro: Alert and oriented X 3. Moves all extremities spontaneously. Gait is normal. CNII-XII grossly in tact. Psych:  Responds to questions appropriately with a normal affect.      ASSESSMENT AND PLAN:  50 y.o. year old male with   1. Family history of premature coronary artery disease He is fasting- - Hepatic function panel - Lipid panel  2.  Hyperlipidemia He is on Lipitor. Is fasting. Recheck labs - Hepatic function panel - Lipid panel  3. Gilbert's syndrome  4. Family history of prostate cancer - PSA--Normal 11/2014  5. Immunizations: Tdap--Given 10/23/2012 Flu Vaccine-- received here 11/2014  Routine follow-up office visit 6 months or sooner if needed.   [address] [place], [place], [facility] 03/27/2016 3:54 PM

## 2016-03-28 ENCOUNTER — Other Ambulatory Visit: Payer: Commercial Managed Care - HMO

## 2016-03-28 LAB — LIPID PANEL
CHOL/HDL RATIO: 2.6 ratio (ref <=5.0)
Cholesterol: 158 mg/dL (ref 125–200)
HDL: 60 mg/dL (ref >=40)
LDL CALC: 77 mg/dL (ref <130)
Triglycerides: 106 mg/dL (ref <150)
VLDL: 21 mg/dL (ref <30)

## 2016-03-28 LAB — HEPATIC FUNCTION PANEL
ALT: 17 U/L (ref 9–46)
AST: 19 U/L (ref 10–40)
Albumin: 4.5 g/dL (ref 3.6–5.1)
Alkaline Phosphatase: 62 U/L (ref 40–115)
BILIRUBIN DIRECT: 0.3 mg/dL — AB (ref <=0.2)
BILIRUBIN TOTAL: 2.2 mg/dL — AB (ref 0.2–1.2)
Indirect Bilirubin: 1.9 mg/dL — ABNORMAL HIGH (ref 0.2–1.2)
TOTAL PROTEIN: 6.9 g/dL (ref 6.1–8.1)

## 2016-03-29 LAB — PSA: PSA: 1.39 ng/mL (ref <=4.00)

## 2016-07-06 ENCOUNTER — Other Ambulatory Visit: Payer: Self-pay | Admitting: Physician Assistant

## 2016-07-07 NOTE — Telephone Encounter (Signed)
RX filled per protocol 

## 2016-08-25 ENCOUNTER — Emergency Department (HOSPITAL_COMMUNITY)
Admission: EM | Admit: 2016-08-25 | Discharge: 2016-08-25 | Disposition: A | Discharge status: Home / Self Care | Payer: Commercial Managed Care - HMO | Attending: Emergency Medicine | Admitting: Emergency Medicine

## 2016-08-25 ENCOUNTER — Telehealth: Payer: Self-pay

## 2016-08-25 ENCOUNTER — Emergency Department (HOSPITAL_COMMUNITY): Payer: Commercial Managed Care - HMO

## 2016-08-25 ENCOUNTER — Encounter (HOSPITAL_COMMUNITY): Payer: Self-pay | Admitting: Emergency Medicine

## 2016-08-25 DIAGNOSIS — R072 Precordial pain: Secondary | ICD-10-CM | POA: Diagnosis present

## 2016-08-25 DIAGNOSIS — Z79899 Other long term (current) drug therapy: Secondary | ICD-10-CM | POA: Insufficient documentation

## 2016-08-25 DIAGNOSIS — R079 Chest pain, unspecified: Secondary | ICD-10-CM | POA: Diagnosis not present

## 2016-08-25 LAB — COMPREHENSIVE METABOLIC PANEL
ALK PHOS: 62 U/L (ref 38–126)
ALT: 23 U/L (ref 17–63)
AST: 27 U/L (ref 15–41)
Albumin: 5.2 g/dL — ABNORMAL HIGH (ref 3.5–5.0)
Anion gap: 7 (ref 5–15)
BUN: 8 mg/dL (ref 6–20)
CALCIUM: 9.8 mg/dL (ref 8.9–10.3)
CO2: 29 mmol/L (ref 22–32)
CREATININE: 0.8 mg/dL (ref 0.61–1.24)
Chloride: 103 mmol/L (ref 101–111)
Glucose, Bld: 98 mg/dL (ref 65–99)
Potassium: 3.4 mmol/L — ABNORMAL LOW (ref 3.5–5.1)
Sodium: 139 mmol/L (ref 135–145)
TOTAL PROTEIN: 8.4 g/dL — AB (ref 6.5–8.1)
Total Bilirubin: 2 mg/dL — ABNORMAL HIGH (ref 0.3–1.2)

## 2016-08-25 LAB — I-STAT TROPONIN, ED: Troponin i, poc: 0 ng/mL (ref 0.00–0.08)

## 2016-08-25 LAB — TROPONIN I

## 2016-08-25 MED ORDER — ASPIRIN 81 MG PO CHEW: 81.0000 mg | CHEWABLE_TABLET | Freq: Every day | ORAL | 2 refills | Status: DC

## 2016-08-25 NOTE — Discharge Instructions (Signed)
Make an appointment to follow-up with cardiology.Start taking a baby aspirin a day. Return for chest pain that starts lasting 15 to 20 minutes or longer.

## 2016-08-25 NOTE — ED Notes (Signed)
ED Provider at bedside. 

## 2016-08-25 NOTE — ED Provider Notes (Signed)
[facility] DEPT Provider Note   CSN: [id number] Arrival date & time: 08/25/16  1431     History   Chief Complaint Chief Complaint  Patient presents with  . Chest Pain    HPI Devin Gross is a 50 y.o. male.  The patient with complaint of intermittent left-sided substernal chest pain lasting 1-2 minutes. Nonradiating. Not associated with shortness of breath. Completely without any discomfort in between. Made worse by movement in the chest area. No nausea no vomiting no fevers no diaphoresis. No history of any cardiac disease in the past. 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 Has not had any pain in the chest in the past.      Past Medical History:  Diagnosis Date  . Calculus of ureter 08/31/2013  . Gilbert's syndrome   . Hypercholesteremia     Patient Active Problem List   Diagnosis Date Noted  . Prostate cancer screening 03/27/2016  . Family history of premature coronary artery disease 11/10/2013  . Calculus of ureter 08/31/2013  . Hyperlipidemia 11/27/2012  . Gilbert's syndrome     Past Surgical History:  Procedure Laterality Date  . EYE SURGERY         Home Medications    Prior to Admission medications   Medication Sig Start Date End Date Taking? Authorizing Provider  atorvastatin (LIPITOR) 20 MG tablet TAKE 1 TABLET BY MOUTH AT BEDTIME. Patient taking differently: TAKE 1/2 TABLET BY MOUTH DAILY 07/07/16  Yes Orlena Sheldon, PA-C  aspirin 81 MG chewable tablet Chew 1 tablet (81 mg total) by mouth daily. 08/25/16   Fredia Sorrow, MD    Family History Family History  Problem Relation Age of Onset  . Heart disease Father 81    CABG no prior hx  . Hypertension Brother   . Hypertension Maternal Grandmother     Social History Social History  Substance Use Topics  . Smoking status: Never Smoker  . Smokeless tobacco: Never Used  . Alcohol use No     Allergies   Patient has no known  allergies.   Review of Systems Review of Systems  Constitutional: Negative for diaphoresis and fever.  HENT: Negative for congestion.   Eyes: Negative for redness.  Respiratory: Negative for shortness of breath.   Cardiovascular: Positive for chest pain. Negative for palpitations and leg swelling.  Gastrointestinal: Negative for abdominal pain, nausea and vomiting.  Genitourinary: Negative for dysuria.  Musculoskeletal: Negative for back pain.  Skin: Negative for rash.  Neurological: Negative for syncope and headaches.  Hematological: Does not bruise/bleed easily.  Psychiatric/Behavioral: Negative for confusion.     Physical Exam Updated Vital Signs BP 114/77   Pulse (!) 49   Temp 97.5 F (36.4 C) (Oral)   Resp 16   Ht 6\' 1"  (1.854 m)   Wt 72.6 kg   SpO2 97%   BMI 21.11 kg/m   Physical Exam  Constitutional: He is oriented to person, place, and time. He appears well-developed and well-nourished. No distress.  HENT:  Head: Normocephalic and atraumatic.  Eyes: EOM are normal. Pupils are equal, round, and reactive to light.  Neck: Normal range of motion. Neck supple.  Cardiovascular: Normal rate, regular rhythm and normal heart sounds.   Pulmonary/Chest: Effort normal and breath sounds normal. No respiratory distress.  Abdominal: Soft. Bowel sounds are normal. There is no tenderness.  Neurological: He is alert and oriented to person, place, and time. No cranial nerve deficit or sensory  deficit. He exhibits normal muscle tone. Coordination normal.  Skin: Skin is warm.  Nursing note and vitals reviewed.    ED Treatments / Results  Labs (all labs ordered are listed, but only abnormal results are displayed) Labs Reviewed  COMPREHENSIVE METABOLIC PANEL - Abnormal; Notable for the following:       Result Value   Potassium 3.4 (*)    Total Protein 8.4 (*)    Albumin 5.2 (*)    Total Bilirubin 2.0 (*)    All other components within normal limits  TROPONIN I  I-STAT  TROPOININ, ED   Results for orders placed or performed during the hospital encounter of 08/25/16  Comprehensive metabolic panel  Result Value Ref Range   Sodium 139 135 - 145 mmol/L   Potassium 3.4 (L) 3.5 - 5.1 mmol/L   Chloride 103 101 - 111 mmol/L   CO2 29 22 - 32 mmol/L   Glucose, Bld 98 65 - 99 mg/dL   BUN 8 6 - 20 mg/dL   Creatinine, Ser 0.80 0.61 - 1.24 mg/dL   Calcium 9.8 8.9 - 10.3 mg/dL   Total Protein 8.4 (H) 6.5 - 8.1 g/dL   Albumin 5.2 (H) 3.5 - 5.0 g/dL   AST 27 15 - 41 U/L   ALT 23 17 - 63 U/L   Alkaline Phosphatase 62 38 - 126 U/L   Total Bilirubin 2.0 (H) 0.3 - 1.2 mg/dL   GFR calc non Af Amer >60 >60 mL/min   GFR calc Af Amer >60 >60 mL/min   Anion gap 7 5 - 15  Troponin I  Result Value Ref Range   Troponin I <0.03 <0.03 ng/mL  I-stat troponin, ED  Result Value Ref Range   Troponin i, poc 0.00 0.00 - 0.08 ng/mL   Comment 3            EKG  EKG Interpretation  Date/Time:  Monday August 25 2016 14:40:05 EST Ventricular Rate:  56 PR Interval:  152 QRS Duration: 100 QT Interval:  390 QTC Calculation: 376 R Axis:   102 Text Interpretation:  Sinus bradycardia Rightward axis Borderline ECG No previous ECGs available Confirmed by Clydene Burack  MD, Zakhi Dupre [phone]) on 08/25/2016 4:29:48 PM       Radiology Dg Chest 2 View  Result Date: 08/25/2016 CLINICAL DATA:  Mid chest pain began this morning ; movement creates a spasm tingling in the chest. No specific injury. Nonsmoker. History of kidney stones. EXAM: CHEST  2 VIEW COMPARISON:  Chest x-ray of July 20, 2014 FINDINGS: The lungs are adequately inflated and clear. The heart and pulmonary vascularity are normal. The mediastinum is normal in width. There is no pleural effusion. The bony thorax exhibits no acute abnormality. IMPRESSION: There is no active cardiopulmonary disease. Electronically Signed   By: David  Martinique M.D.   On: 08/25/2016 15:09    Procedures Procedures (including critical care  time)  Medications Ordered in ED Medications - No data to display   Initial Impression / Assessment and Plan / ED Course  I have reviewed the triage vital signs and the nursing notes.  Pertinent labs & imaging results that were available during my care of the patient were reviewed by me and considered in my medical decision making (see chart for details).  Clinical Course     chest pain by history is brief lasting 1-2 minutes.  troponins negative 2.  chest x-ray without any acute findings.  Patient will return for any chest pain lasting longer.  Patient will follow-up with cardiology. Patient will be started on baby aspirin a day.  Symptoms seem to be consistent maybe to chest wall pain. Also recommended Advil or Aleve..   Final Clinical Impressions(s) / ED Diagnoses   Final diagnoses:  Chest pain, unspecified type    New Prescriptions New Prescriptions   ASPIRIN 81 MG CHEWABLE TABLET    Chew 1 tablet (81 mg total) by mouth daily.     Fredia Sorrow, MD 08/25/16 [phone]

## 2016-08-25 NOTE — Telephone Encounter (Signed)
Pt called stating he was having chest pain. Instructed pt to go to ER

## 2016-08-25 NOTE — ED Triage Notes (Signed)
Pt was working today, stated having spasms in chest, went to urgent care, was sent here

## 2016-10-02 ENCOUNTER — Encounter: Payer: Self-pay | Admitting: Physician Assistant

## 2016-10-02 ENCOUNTER — Ambulatory Visit (INDEPENDENT_AMBULATORY_CARE_PROVIDER_SITE_OTHER): Payer: Commercial Managed Care - HMO | Admitting: Physician Assistant

## 2016-10-02 VITALS — BP 110/60 | HR 52 | Temp 97.2°F | Resp 18 | Wt 158.0 lb

## 2016-10-02 DIAGNOSIS — Z8249 Family history of ischemic heart disease and other diseases of the circulatory system: Secondary | ICD-10-CM | POA: Diagnosis not present

## 2016-10-02 DIAGNOSIS — E785 Hyperlipidemia, unspecified: Secondary | ICD-10-CM | POA: Diagnosis not present

## 2016-10-02 DIAGNOSIS — Z125 Encounter for screening for malignant neoplasm of prostate: Secondary | ICD-10-CM

## 2016-10-02 NOTE — Progress Notes (Signed)
Patient ID: Devin Gross MRN: [id number], DOB: Dec 08, 1965, 50 y.o. Date of Encounter: 10/02/2016, 4:37 PM    Chief Complaint:  Chief Complaint  Patient presents with  . Hyperlipidemia     f/u     HPI: 50 y.o. year old AA male here for routine followup office visit is hyperlipidemia. He has a family history of premature CAD. Father had CABG at age 74. He has no history of hypertension and has never smoked. However he does have hyperlipidemia which has been managed with medications to reduce his risk of CAD.  He had been taking Lipitor 40mg .  At his OV 11/10/2013, he asked about possibly reducing the dose. Said that he started Lipitor in his 24s. Has been on it for over 20 years.  He said he has heard these medications can cause problems, including to the liver. Felt that 40 mg was a significant dose and was wondering about reducing this.  At that OV I decreased the dose from 40mg  to 20mg . He is taking his Lipitor to 20 mg. He continues to have no myalgias or other adverse effects.  He was working with  a Armed forces technical officer doing asphalt, Scientist, water quality. At [place] 11/2014 reports that he has gotten a job with the city of [place]. Says he is doing similar work but is more active now with shoveling and other things. 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 Says that now with his job he has Fridays off. Works 6:30 AM to 5:00 PM 4 days a week.  He lived in [place] a while. Then in 2011-01-29 his father passed away, he moved to [place] and has been living with his mom there since. Lives with his mom and it is just the 2 of them.  At office visit 11/2014 he says that he has multiple family members with prostate cancer. A cousin, 2 uncles, and his father also had prostate cancer.  Checked PSA at that time--normal.  Checked PSA again 03/27/2016--- normal   10/02/2016: Still working with [employer]. Still lives with his mom.  Taking Lipitor 20mg . No myalgias  or other adv effects.  He reports that in November he was at work and complained of chest pain so they made him go to the ER. Says that the symptoms he was having at that time was that every time he would move his body and change position with his upper body he would feel a spasm in his chest. Says that a couple days prior to that he had loaded wood into a big truck. Thinks that that had caused musculoskeletal chest pain. Says that the tests in the ER all came back negative. Today states that he has had no recurrence of chest pain. We discussed the fact that that episode of chest pain does sound like it was musculoskeletal. Today I discussed with him what angina symptoms would feel like and indications to go to the ER.  No other concerns to address today.   Home Meds:  Outpatient Medications Prior to Visit  Medication Sig Dispense Refill  . atorvastatin (LIPITOR) 20 MG tablet TAKE 1 TABLET BY MOUTH AT BEDTIME. (Patient taking differently: TAKE 1/2 TABLET BY MOUTH DAILY) 90 tablet 0  . aspirin 81 MG chewable tablet Chew 1 tablet (81 mg total) by mouth daily. (Patient not taking: Reported on 10/02/2016) 30 tablet 2   No facility-administered medications prior to visit.      Allergies: No Known Allergies  Review of Systems: See HPI for pertinent ROS. All other ROS negative.    Physical Exam: Blood pressure 110/60, pulse (!) 52, temperature 97.2 F (36.2 C), temperature source Oral, resp. rate 18, weight 158 lb (71.7 kg), SpO2 98 %., Body mass index is 20.85 kg/m. General: NWD AAM. Appears in no acute distress. Neck: Supple. No thyromegaly. No lymphadenopathy. No carotid bruits. Lungs: Clear bilaterally to auscultation without wheezes, rales, or rhonchi. Breathing is unlabored. Heart: Regular rhythm. No murmurs, rubs, or gallops. Abdomen: Soft, non-tender, non-distended with normoactive bowel sounds. No hepatomegaly. No rebound/guarding. No obvious abdominal masses. Msk:  Strength and  tone normal for age. Extremities/Skin: Warm and dry. Neuro: Alert and oriented X 3. Moves all extremities spontaneously. Gait is normal. CNII-XII grossly in tact. Psych:  Responds to questions appropriately with a normal affect.      ASSESSMENT AND PLAN:  50 y.o. year old male with   1. Family history of premature coronary artery disease He is fasting- - Hepatic function panel - Lipid panel  2. Hyperlipidemia He is on Lipitor. Is fasting. Recheck labs - Hepatic function panel - Lipid panel  3. Gilbert's syndrome  4. Family history of prostate cancer - PSA--Normal 11/2014, 03/2016  5. Immunizations: Tdap--Given 10/23/2012 Flu Vaccine-- received here 11/2014. 10/02/16 he states that he had flu vaccine at work this year.  Routine follow-up office visit 6 months or sooner if needed.   [address] [place], [place], [facility] 10/02/2016 4:37 PM

## 2016-10-03 LAB — HEPATIC FUNCTION PANEL
ALT: 31 U/L (ref 9–46)
AST: 33 U/L (ref 10–40)
Albumin: 4.6 g/dL (ref 3.6–5.1)
Alkaline Phosphatase: 53 U/L (ref 40–115)
Bilirubin, Direct: 0.3 mg/dL — ABNORMAL HIGH (ref <=0.2)
Indirect Bilirubin: 1.4 mg/dL — ABNORMAL HIGH (ref 0.2–1.2)
Total Bilirubin: 1.7 mg/dL — ABNORMAL HIGH (ref 0.2–1.2)
Total Protein: 6.8 g/dL (ref 6.1–8.1)

## 2016-10-03 LAB — LIPID PANEL
Cholesterol: 167 mg/dL (ref <200)
HDL: 61 mg/dL (ref >40)
LDL Cholesterol: 91 mg/dL (ref <100)
Total CHOL/HDL Ratio: 2.7 ratio (ref <5.0)
Triglycerides: 76 mg/dL (ref <150)
VLDL: 15 mg/dL (ref <30)

## 2017-01-17 ENCOUNTER — Other Ambulatory Visit: Payer: Self-pay | Admitting: Physician Assistant

## 2017-01-19 NOTE — Telephone Encounter (Signed)
RX filled per protocol 

## 2017-02-11 ENCOUNTER — Encounter: Payer: Self-pay | Admitting: Physician Assistant

## 2017-02-11 ENCOUNTER — Ambulatory Visit (INDEPENDENT_AMBULATORY_CARE_PROVIDER_SITE_OTHER): Payer: Commercial Managed Care - HMO | Admitting: Physician Assistant

## 2017-02-11 VITALS — BP 130/88 | HR 56 | Temp 97.4°F | Resp 16 | Wt 162.6 lb

## 2017-02-11 DIAGNOSIS — K2970 Gastritis, unspecified, without bleeding: Secondary | ICD-10-CM | POA: Diagnosis not present

## 2017-02-11 MED ORDER — PROMETHAZINE HCL 25 MG/ML IJ SOLN
25.0000 mg | Freq: Once | INTRAMUSCULAR | Status: AC
  Administered 2017-02-11: 25 mg via INTRAMUSCULAR

## 2017-02-11 MED ORDER — PROMETHAZINE HCL 25 MG PO TABS: 25.0000 mg | ORAL_TABLET | ORAL | 0 refills | Status: DC | PRN

## 2017-02-11 NOTE — Progress Notes (Signed)
    Patient ID: Devin Gross MRN: [id number], DOB: 1966-03-08, 51 y.o. Date of Encounter: 02/11/2017, 12:36 PM    Chief Complaint:  Chief Complaint  Patient presents with  . Dizziness    x1 day  . Nausea  . Emesis     HPI: 51 y.o. year old male presents with above.   His mom accompanies him for OV today.   Patient states that yesterday he felt a little bit lightheaded and a little bit nauseous but he was able to work his full work schedule and even ate dinner last night without any problems. This morning when he woke up to get ready for work at about 4:30 AM he noticed that he felt bad. He vomited once at about that time and then vomited once more on the way here. Has had no local/focal abdominal pain. Has had no diarrhea. Has had no fever. Has noticed no other symptoms.     Home Meds:   Outpatient Medications Prior to Visit  Medication Sig Dispense Refill  . atorvastatin (LIPITOR) 20 MG tablet TAKE 1 TABLET BY MOUTH AT BEDTIME. 90 tablet 0  . aspirin 81 MG chewable tablet Chew 1 tablet (81 mg total) by mouth daily. (Patient not taking: Reported on 10/02/2016) 30 tablet 2   No facility-administered medications prior to visit.     Allergies: No Known Allergies    Review of Systems: See HPI for pertinent ROS. All other ROS negative.    Physical Exam: Blood pressure 130/88, pulse (!) 56, temperature 97.4 F (36.3 C), temperature source Oral, resp. rate 16, weight 162 lb 9.6 oz (73.8 kg), SpO2 98 %., Body mass index is 21.45 kg/m. General: WNWD AAM.  Appears in no acute distress. Neck: Supple. No thyromegaly. No lymphadenopathy. Lungs: Clear bilaterally to auscultation without wheezes, rales, or rhonchi. Breathing is unlabored. Heart: Regular rhythm. No murmurs, rubs, or gallops. Abdomen: Soft, non-tender, non-distended with normoactive bowel sounds. No hepatomegaly. No rebound/guarding. No obvious abdominal masses. I palpated entire abdomen and there is no area of  tenderness at all. Msk:  Strength and tone normal for age. Extremities/Skin: Warm and dry.  Neuro: Alert and oriented X 3. Moves all extremities spontaneously. Gait is normal. CNII-XII grossly in tact. Psych:  Responds to questions appropriately with a normal affect.     ASSESSMENT AND PLAN:  51 y.o. year old male with  1. Viral gastritis Will give Phenergan injection here so that will go ahead and take effect. He will then use oral Phenergan every 4-6 hours as needed for nausea. He is to stay with clear liquid diet and only eat chicken noodle soup or plain crackers or plain toast. Avoid other solid foods that his system has to work to digest. He states that he was supposed to be working today and tomorrow but then is off Friday Saturday Sunday. Give note for out of work today and tomorrow with plans to return Monday. Told him to follow-up if develops any area of localized abdominal pain or fever or if symptoms worsen. He and his mother voice understanding and agree. - promethazine (PHENERGAN) 25 MG tablet; Take 1 tablet (25 mg total) by mouth every 4 (four) hours as needed for nausea or vomiting.  Dispense: 30 tablet; Refill: 0   Signed, [address] [place], [place], [facility] 02/11/2017 12:36 PM

## 2017-02-11 NOTE — Addendum Note (Signed)
Addended by: Vonna Kotyk A on: 02/11/2017 12:44 PM   Modules accepted: Orders

## 2017-04-02 ENCOUNTER — Ambulatory Visit (INDEPENDENT_AMBULATORY_CARE_PROVIDER_SITE_OTHER): Payer: Commercial Managed Care - HMO | Admitting: Physician Assistant

## 2017-04-02 ENCOUNTER — Encounter: Payer: Self-pay | Admitting: Physician Assistant

## 2017-04-02 DIAGNOSIS — Z125 Encounter for screening for malignant neoplasm of prostate: Secondary | ICD-10-CM

## 2017-04-02 DIAGNOSIS — E785 Hyperlipidemia, unspecified: Secondary | ICD-10-CM

## 2017-04-02 DIAGNOSIS — Z8249 Family history of ischemic heart disease and other diseases of the circulatory system: Secondary | ICD-10-CM | POA: Diagnosis not present

## 2017-04-02 DIAGNOSIS — J309 Allergic rhinitis, unspecified: Secondary | ICD-10-CM

## 2017-04-02 MED ORDER — CETIRIZINE HCL 10 MG PO TABS: 10.0000 mg | ORAL_TABLET | Freq: Every day | ORAL | 11 refills | Status: DC

## 2017-04-02 NOTE — Progress Notes (Signed)
Patient ID: Devin Gross MRN: [id number], DOB: 30-Jul-1966, 51 y.o. Date of Encounter: 04/02/2017, 3:54 PM    Chief Complaint:  Chief Complaint  Patient presents with  . 6 month routine follow up  . nose gets stopped up    making it hard to breathe     HPI: 51 y.o. year old AA male here for routine followup office visit is hyperlipidemia. He has a family history of premature CAD. Father had CABG at age 22. He has no history of hypertension and has never smoked. However he does have hyperlipidemia which has been managed with medications to reduce his risk of CAD.  He had been taking Lipitor 40mg .  At his OV 11/10/2013, he asked about possibly reducing the dose. Said that he started Lipitor in his 36s. Has been on it for over 20 years.  He said he has heard these medications can cause problems, including to the liver. Felt that 40 mg was a significant dose and was wondering about reducing this.  At that OV I decreased the dose from 40mg  to 20mg . He is taking his Lipitor to 20 mg. He continues to have no myalgias or other adverse effects.  He was working with  a Armed forces technical officer doing asphalt, Scientist, water quality. At [place] 11/2014 reports that he has gotten a job with the city of [place]. Says he is doing similar work but is more active now with shoveling and other things. 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 Says that now with his job he has Fridays off. Works 6:30 AM to 5:00 PM 4 days a week.  He lived in [place] a while. Then in 2011/01/07 his father passed away, he moved to [place] and has been living with his mom there since. Lives with his mom and it is just the 2 of them.  At office visit 11/2014 he says that he has multiple family members with prostate cancer. A cousin, 2 uncles, and his father also had prostate cancer.  Checked PSA at that time--normal.  Checked PSA again 03/27/2016--- normal   10/02/2016: Still working with [employer]. Still  lives with his mom.  Taking Lipitor 20mg . No myalgias or other adv effects.  He reports that in November he was at work and complained of chest pain so they made him go to the ER. Says that the symptoms he was having at that time was that every time he would move his body and change position with his upper body he would feel a spasm in his chest. Says that a couple days prior to that he had loaded wood into a big truck. Thinks that that had caused musculoskeletal chest pain. Says that the tests in the ER all came back negative. Today states that he has had no recurrence of chest pain. We discussed the fact that that episode of chest pain does sound like it was musculoskeletal. Today I discussed with him what angina symptoms would feel like and indications to go to the ER.  No other concerns to address today.   04/02/2017: Still working with [employer]. Still lives with his mom.  Taking Lipitor 20mg . No myalgias or other adv effects.  He states that he recently has been having some nasal congestion. Is using a nasal spray at night and that does help and he can breathe at night but then during the day his nose is getting stopped up again. Sometimes has some rhinorrhea and this is  watery and clear but mostly it is just a sensation of nasal congestion. No sneezing. No cough. No sore throat. No fevers. No other concerns to address today. Says that he has been feeling great.  ------------NOTE ---HE WILL BE 51 Y/O AT NEXT OV----WILL DISCUSS COLONOSCOPY, SHINGRIX--------------------  Home Meds:  Outpatient Medications Prior to Visit  Medication Sig Dispense Refill  . atorvastatin (LIPITOR) 20 MG tablet TAKE 1 TABLET BY MOUTH AT BEDTIME. 90 tablet 0  . promethazine (PHENERGAN) 25 MG tablet Take 1 tablet (25 mg total) by mouth every 4 (four) hours as needed for nausea or vomiting. 30 tablet 0   No facility-administered medications prior to visit.      Allergies: No Known Allergies     Review of Systems: See HPI for pertinent ROS. All other ROS negative.    Physical Exam: Blood pressure 120/82, pulse (!) 57, temperature 97.9 F (36.6 C), temperature source Oral, resp. rate 16, height 6\' 1"  (1.854 m), weight 164 lb (74.4 kg), SpO2 98 %., Body mass index is 21.64 kg/m. General: NWD AAM. Appears in no acute distress. Neck: Supple. No thyromegaly. No lymphadenopathy. No carotid bruits. Lungs: Clear bilaterally to auscultation without wheezes, rales, or rhonchi. Breathing is unlabored. Heart: Regular rhythm. No murmurs, rubs, or gallops. Abdomen: Soft, non-tender, non-distended with normoactive bowel sounds. No hepatomegaly. No rebound/guarding. No obvious abdominal masses. Msk:  Strength and tone normal for age. Extremities/Skin: Warm and dry. Neuro: Alert and oriented X 3. Moves all extremities spontaneously. Gait is normal. CNII-XII grossly in tact. Psych:  Responds to questions appropriately with a normal affect.      ASSESSMENT AND PLAN:  51 y.o. year old male with   ------------NOTE ---HE WILL BE 51 Y/O AT NEXT OV----WILL DISCUSS COLONOSCOPY, SHINGRIX--------------------  1. Family history of premature coronary artery disease He is fasting- - Hepatic function panel - Lipid panel  2. Hyperlipidemia He is on Lipitor. Is fasting. Recheck labs - Hepatic function panel - Lipid panel  3. Gilbert's syndrome  4. Family history of prostate cancer - PSA--Normal 11/2014, 03/2016 04/02/2017 recheck PSA now  5. Allergic rhinitis, unspecified seasonality, unspecified trigger Continue to use nasal spray. Add Zyrtec. If Zyrtec causes drowsiness then take this at night. - cetirizine (ZYRTEC) 10 MG tablet; Take 1 tablet (10 mg total) by mouth daily.  Dispense: 30 tablet; Refill: 11  6. Immunizations: Tdap--Given 10/23/2012 Flu Vaccine-- received here 11/2014. 10/02/16 he states that he had flu vaccine at work this year.   ------------NOTE ---HE WILL BE 51 Y/O AT  NEXT OV----WILL DISCUSS COLONOSCOPY, SHINGRIX--------------------  Routine follow-up office visit 6 months or sooner if needed.   [address] [place], [place], [facility] 04/02/2017 3:54 PM

## 2017-04-03 LAB — PSA: PSA: 1.1 ng/mL (ref <=4.0)

## 2017-04-03 LAB — LIPID PANEL
Cholesterol: 180 mg/dL (ref <200)
HDL: 65 mg/dL (ref >40)
LDL Cholesterol: 99 mg/dL (ref <100)
TRIGLYCERIDES: 79 mg/dL (ref <150)
Total CHOL/HDL Ratio: 2.8 Ratio (ref <5.0)
VLDL: 16 mg/dL (ref <30)

## 2017-04-03 LAB — COMPLETE METABOLIC PANEL WITH GFR
ALBUMIN: 4.9 g/dL (ref 3.6–5.1)
ALK PHOS: 68 U/L (ref 40–115)
ALT: 25 U/L (ref 9–46)
AST: 23 U/L (ref 10–40)
BILIRUBIN TOTAL: 2.1 mg/dL — AB (ref 0.2–1.2)
BUN: 11 mg/dL (ref 7–25)
CALCIUM: 9.7 mg/dL (ref 8.6–10.3)
CO2: 23 mmol/L (ref 20–31)
Chloride: 105 mmol/L (ref 98–110)
Creat: 0.79 mg/dL (ref 0.60–1.35)
Glucose, Bld: 87 mg/dL (ref 70–99)
Potassium: 3.8 mmol/L (ref 3.5–5.3)
Sodium: 141 mmol/L (ref 135–146)
TOTAL PROTEIN: 7.3 g/dL (ref 6.1–8.1)

## 2017-09-12 ENCOUNTER — Other Ambulatory Visit: Payer: Self-pay | Admitting: Physician Assistant

## 2017-09-16 NOTE — Telephone Encounter (Signed)
Refill appropriate 

## 2017-10-01 ENCOUNTER — Ambulatory Visit: Payer: Commercial Managed Care - HMO | Admitting: Physician Assistant

## 2017-10-09 ENCOUNTER — Encounter: Payer: Self-pay | Admitting: Family Medicine

## 2017-10-09 ENCOUNTER — Ambulatory Visit: Payer: Commercial Managed Care - HMO | Admitting: Family Medicine

## 2017-10-09 VITALS — BP 130/70 | HR 58 | Temp 97.7°F | Resp 12 | Ht 73.0 in | Wt 168.0 lb

## 2017-10-09 DIAGNOSIS — K921 Melena: Secondary | ICD-10-CM | POA: Diagnosis not present

## 2017-10-09 NOTE — Progress Notes (Signed)
   Subjective:    Patient ID: Devin Gross, male    DOB: 1966/09/14, 52 y.o.   MRN: [id number]  HPI On several occaisons in mid December, patient saw trace amounts of blood when he would wipe after defecation.  Defecation was painless.  Symptoms resolved after one week.  Never had colonosocpy.   Past Medical History:  Diagnosis Date  . Calculus of ureter 08/31/2013  . Gilbert's syndrome   . Hypercholesteremia    Past Surgical History:  Procedure Laterality Date  . EYE SURGERY     Current Outpatient Medications on File Prior to Visit  Medication Sig Dispense Refill  . atorvastatin (LIPITOR) 20 MG tablet TAKE 1 TABLET BY MOUTH AT BEDTIME. 30 tablet 0  . cetirizine (ZYRTEC) 10 MG tablet Take 1 tablet (10 mg total) by mouth daily. 30 tablet 11   No current facility-administered medications on file prior to visit.    No Known Allergies Social History   Socioeconomic History  . Marital status: Single    Spouse name: Not on file  . Number of children: Not on file  . Years of education: Not on file  . Highest education level: Not on file  Social Needs  . Financial resource strain: Not on file  . Food insecurity - worry: Not on file  . Food insecurity - inability: Not on file  . Transportation needs - medical: Not on file  . Transportation needs - non-medical: Not on file  Occupational History  . Not on file  Tobacco Use  . Smoking status: Never Smoker  . Smokeless tobacco: Never Used  Substance and Sexual Activity  . Alcohol use: No  . Drug use: No  . Sexual activity: Not on file  Other Topics Concern  . Not on file  Social History Narrative  . Not on file      Review of Systems  All other systems reviewed and are negative.      Objective:   Physical Exam  HENT:  Right Ear: External ear normal.  Left Ear: External ear normal.  Nose: Nose normal.  Mouth/Throat: Oropharynx is clear and moist. No oropharyngeal exudate.  Eyes: Conjunctivae are normal.    Neck: Neck supple.  Cardiovascular: Normal rate, regular rhythm and normal heart sounds.  No murmur heard. Pulmonary/Chest: Effort normal. He has no rales.  Abdominal: Soft. Bowel sounds are normal. He exhibits no distension. There is no tenderness. There is no rebound and no guarding.  Genitourinary: Rectum normal and prostate normal.  Lymphadenopathy:    He has no cervical adenopathy.  Vitals reviewed.         Assessment & Plan:  Blood in stool - Plan: Ambulatory referral to Gastroenterology  Rectal exam is normal today though limited by patient's discomfort.  I suspect irriation from wiping as the source or maybe small fissure that healed.  Would recommend screening colonoscopy as a function of age.

## 2017-10-19 ENCOUNTER — Encounter: Payer: Self-pay | Admitting: Gastroenterology

## 2017-11-24 ENCOUNTER — Ambulatory Visit: Payer: Commercial Managed Care - HMO | Admitting: Gastroenterology

## 2017-11-27 ENCOUNTER — Ambulatory Visit: Payer: 59 | Admitting: Nurse Practitioner

## 2017-11-27 ENCOUNTER — Other Ambulatory Visit: Payer: Self-pay | Admitting: Physician Assistant

## 2017-11-27 ENCOUNTER — Encounter: Payer: Self-pay | Admitting: Nurse Practitioner

## 2017-11-27 ENCOUNTER — Other Ambulatory Visit: Payer: Self-pay

## 2017-11-27 DIAGNOSIS — K921 Melena: Secondary | ICD-10-CM | POA: Insufficient documentation

## 2017-11-27 MED ORDER — NA SULFATE-K SULFATE-MG SULF 17.5-3.13-1.6 GM/177ML PO SOLN: 1.0000 | ORAL | 0 refills | Status: DC

## 2017-11-27 NOTE — Progress Notes (Addendum)
REVIEWED-NO ADDITIONAL RECOMMENDATIONS.  Primary Care Physician:  Rennis Golden Primary Gastroenterologist:  Dr. Oneida Alar   Chief Complaint  Patient presents with  . Blood In Stools    none in 1 month    HPI:   Devin Gross is a 52 y.o. male who presents on referral from primary care for blood in his stool.  Last PCP note reviewed dated 10/09/2017 which found trace amount of blood on toilet tissue after wiping post defecation.  No rectal pain.  But this occurred for 1 week.  Has never had a colonoscopy and is currently age 3.  Today he states he's doing well overall. Had scant toilet tissue hematochezia a month ago which lasted a week; none since. States he thinks he was wiping too hard. Denies rectal pain, itching, irritation, fullness. His PCP queried if he had possible a small hemorrhoid but not definitely. Denies abdominal pain, N/V, melena, fever, chills, unintentional weight loss. Denies chest pain, dyspnea, dizziness, lightheadedness, syncope, near syncope. Denies any other upper or lower GI symptoms.  Past Medical History:  Diagnosis Date  . Calculus of ureter 08/31/2013  . Gilbert's syndrome   . Hypercholesteremia     Past Surgical History:  Procedure Laterality Date  . EYE SURGERY      Current Outpatient Medications  Medication Sig Dispense Refill  . atorvastatin (LIPITOR) 20 MG tablet TAKE 1 TABLET BY MOUTH AT BEDTIME. 30 tablet 0  . cetirizine (ZYRTEC) 10 MG tablet Take 1 tablet (10 mg total) by mouth daily. (Patient taking differently: Take 10 mg by mouth as needed. ) 30 tablet 11   No current facility-administered medications for this visit.     Allergies as of 11/27/2017  . (No Known Allergies)    Family History  Problem Relation Age of Onset  . Heart disease Father 32       CABG no prior hx  . Hypertension Brother   . Hypertension Maternal Grandmother   . Colon cancer Neg Hx     Social History   Socioeconomic History  . Marital status:  Single    Spouse name: Not on file  . Number of children: Not on file  . Years of education: Not on file  . Highest education level: Not on file  Social Needs  . Financial resource strain: Not on file  . Food insecurity - worry: Not on file  . Food insecurity - inability: Not on file  . Transportation needs - medical: Not on file  . Transportation needs - non-medical: Not on file  Occupational History  . Not on file  Tobacco Use  . Smoking status: Never Smoker  . Smokeless tobacco: Never Used  Substance and Sexual Activity  . Alcohol use: No  . Drug use: No  . Sexual activity: Not on file  Other Topics Concern  . Not on file  Social History Narrative  . Not on file    Review of Systems: Complete ROS negative except as per HPI.    Physical Exam: BP 126/72   Pulse (!) 56   Temp (!) 97.1 F (36.2 C) (Oral)   Ht 6\' 1"  (1.854 m)   Wt 161 lb 9.6 oz (73.3 kg)   BMI 21.32 kg/m  General:   Alert and oriented. Pleasant and cooperative. Well-nourished and well-developed.  Head:  Normocephalic and atraumatic. Eyes:  Without icterus, sclera clear and conjunctiva pink.  Ears:  Normal auditory acuity. Cardiovascular:  S1, S2 present without murmurs appreciated. Extremities without  clubbing or edema. Respiratory:  Clear to auscultation bilaterally. No wheezes, rales, or rhonchi. No distress.  Gastrointestinal:  +BS, soft, non-tender and non-distended. No HSM noted. No guarding or rebound. No masses appreciated.  Rectal:  Deferred  Musculoskalatal:  Symmetrical without gross deformities. Neurologic:  Alert and oriented x4;  grossly normal neurologically. Psych:  Alert and cooperative. Normal mood and affect. Heme/Lymph/Immune: No excessive bruising noted.    11/27/2017 10:47 AM   Disclaimer: This note was dictated with voice recognition software. Similar sounding words can inadvertently be transcribed and may not be corrected upon review.

## 2017-11-27 NOTE — Patient Instructions (Signed)
PA info for TCS submitted via [facility] website. Case approved. PA# [id number].

## 2017-11-27 NOTE — Patient Instructions (Signed)
1. We will schedule your colonoscopy for you. 2. Further recommendations will be made based on results of your colonoscopy. 3. Return for follow-up based on recommendations made after your colonoscopy. 4. Call us if you have any questions or concerns.

## 2017-11-27 NOTE — Progress Notes (Signed)
CC'D TO PCP °

## 2017-11-27 NOTE — Assessment & Plan Note (Signed)
Hematochezia has resolved.  It only lasted for about 1 week and was associated with "overzealous wiping after defecation."  No hemorrhoid symptoms.  Essentially asymptomatic from a GI standpoint.  Regardless, he is due for his first-ever colonoscopy.  We will plan to proceed at this point.  Proceed with colonoscopy with Dr. Oneida Alar in the near future. The risks, benefits, and alternatives have been discussed in detail with the patient. They state understanding and desire to proceed.   Patient is not on any anticoagulants, anxiolytics, chronic pain medications, or antidepressants.  Conscious sedation should be adequate for his procedure.

## 2017-12-28 ENCOUNTER — Ambulatory Visit (HOSPITAL_COMMUNITY)
Admission: RE | Admit: 2017-12-28 | Discharge: 2017-12-28 | Disposition: A | Discharge status: Home / Self Care | Payer: 59 | Source: Ambulatory Visit | Attending: Gastroenterology | Admitting: Gastroenterology

## 2017-12-28 ENCOUNTER — Other Ambulatory Visit: Payer: Self-pay

## 2017-12-28 ENCOUNTER — Encounter (HOSPITAL_COMMUNITY): Payer: Self-pay | Admitting: *Deleted

## 2017-12-28 ENCOUNTER — Encounter (HOSPITAL_COMMUNITY)
Admission: RE | Disposition: A | Discharge status: Home / Self Care | Payer: Self-pay | Source: Ambulatory Visit | Attending: Gastroenterology

## 2017-12-28 DIAGNOSIS — Z87442 Personal history of urinary calculi: Secondary | ICD-10-CM | POA: Insufficient documentation

## 2017-12-28 DIAGNOSIS — K635 Polyp of colon: Secondary | ICD-10-CM | POA: Diagnosis not present

## 2017-12-28 DIAGNOSIS — E7800 Pure hypercholesterolemia, unspecified: Secondary | ICD-10-CM | POA: Diagnosis not present

## 2017-12-28 DIAGNOSIS — Z79899 Other long term (current) drug therapy: Secondary | ICD-10-CM | POA: Insufficient documentation

## 2017-12-28 DIAGNOSIS — K921 Melena: Secondary | ICD-10-CM

## 2017-12-28 DIAGNOSIS — D125 Benign neoplasm of sigmoid colon: Secondary | ICD-10-CM | POA: Diagnosis not present

## 2017-12-28 DIAGNOSIS — D123 Benign neoplasm of transverse colon: Secondary | ICD-10-CM | POA: Insufficient documentation

## 2017-12-28 DIAGNOSIS — D122 Benign neoplasm of ascending colon: Secondary | ICD-10-CM | POA: Diagnosis not present

## 2017-12-28 DIAGNOSIS — K644 Residual hemorrhoidal skin tags: Secondary | ICD-10-CM | POA: Insufficient documentation

## 2017-12-28 HISTORY — PX: COLONOSCOPY: SHX5424

## 2017-12-28 SURGERY — COLONOSCOPY
Anesthesia: Moderate Sedation

## 2017-12-28 MED ORDER — STERILE WATER FOR IRRIGATION IR SOLN
Status: DC | PRN
  Administered 2017-12-28: 100 mL

## 2017-12-28 MED ORDER — MIDAZOLAM HCL 5 MG/5ML IJ SOLN
INTRAMUSCULAR | Status: AC
  Filled 2017-12-28: qty 10

## 2017-12-28 MED ORDER — MEPERIDINE HCL 100 MG/ML IJ SOLN
INTRAMUSCULAR | Status: DC | PRN
  Administered 2017-12-28 (×2): 25 mg via INTRAVENOUS

## 2017-12-28 MED ORDER — SODIUM CHLORIDE 0.9 % IV SOLN
INTRAVENOUS | Status: DC
  Administered 2017-12-28: 11:00:00 via INTRAVENOUS

## 2017-12-28 MED ORDER — MEPERIDINE HCL 100 MG/ML IJ SOLN
INTRAMUSCULAR | Status: AC
  Filled 2017-12-28: qty 2

## 2017-12-28 MED ORDER — MIDAZOLAM HCL 5 MG/5ML IJ SOLN
INTRAMUSCULAR | Status: DC | PRN
  Administered 2017-12-28: 1 mg via INTRAVENOUS
  Administered 2017-12-28 (×2): 2 mg via INTRAVENOUS

## 2017-12-28 NOTE — Progress Notes (Signed)
Devin Gross may not drive, operate heavy machinery, or sign legal documents until after 12noon on Tuesday March 26th, 2019.

## 2017-12-28 NOTE — Discharge Instructions (Signed)
You had 4 polyps removed. You have SMALL EXTERNAL hemorrhoids.   DRINK WATER TO KEEP YOUR URINE LIGHT YELLOW.  FOLLOW A HIGH FIBER DIET. AVOID ITEMS THAT CAUSE BLOATING & GAS. SEE INFO BELOW.  YOUR BIOPSY RESULTS WILL BE AVAILABLE IN 7 DAYS.   Next colonoscopy in 3 years.    Colonoscopy Care After Read the instructions outlined below and refer to this sheet in the next week. These discharge instructions provide you with general information on caring for yourself after you leave the hospital. While your treatment has been planned according to the most current medical practices available, unavoidable complications occasionally occur. If you have any problems or questions after discharge, call DR. Murlean Seelye, [phone].  ACTIVITY  You may resume your regular activity, but move at a slower pace for the next 24 hours.   Take frequent rest periods for the next 24 hours.   Walking will help get rid of the air and reduce the bloated feeling in your belly (abdomen).   No driving for 24 hours (because of the medicine (anesthesia) used during the test).   You may shower.   Do not sign any important legal documents or operate any machinery for 24 hours (because of the anesthesia used during the test).    NUTRITION  Drink plenty of fluids.   You may resume your normal diet as instructed by your doctor.   Begin with a light meal and progress to your normal diet. Heavy or fried foods are harder to digest and may make you feel sick to your stomach (nauseated).   Avoid alcoholic beverages for 24 hours or as instructed.    MEDICATIONS  You may resume your normal medications.   WHAT YOU CAN EXPECT TODAY  Some feelings of bloating in the abdomen.   Passage of more gas than usual.   Spotting of blood in your stool or on the toilet paper  .  IF YOU HAD POLYPS REMOVED DURING THE COLONOSCOPY:  Eat a soft diet IF YOU HAVE NAUSEA, BLOATING, ABDOMINAL PAIN, OR VOMITING.    FINDING  OUT THE RESULTS OF YOUR TEST Not all test results are available during your visit. DR. Oneida Alar WILL CALL YOU WITHIN 14 DAYS OF YOUR PROCEDUE WITH YOUR RESULTS. Do not assume everything is normal if you have not heard from DR. Brisia Schuermann, CALL HER OFFICE AT [phone].  SEEK IMMEDIATE MEDICAL ATTENTION AND CALL THE OFFICE: [phone] IF:  You have more than a spotting of blood in your stool.   Your belly is swollen (abdominal distention).   You are nauseated or vomiting.   You have a temperature over 101F.   You have abdominal pain or discomfort that is severe or gets worse throughout the day.   High-Fiber Diet A high-fiber diet changes your normal diet to include more whole grains, legumes, fruits, and vegetables. Changes in the diet involve replacing refined carbohydrates with unrefined foods. The calorie level of the diet is essentially unchanged. The Dietary Reference Intake (recommended amount) for adult males is 38 grams per day. For adult females, it is 25 grams per day. Pregnant and lactating women should consume 28 grams of fiber per day. Fiber is the intact part of a plant that is not broken down during digestion. Functional fiber is fiber that has been isolated from the plant to provide a beneficial effect in the body. PURPOSE  Increase stool bulk.   Ease and regulate bowel movements.   Lower cholesterol.   REDUCE RISK OF COLON CANCER  INDICATIONS THAT YOU NEED MORE FIBER  Constipation and hemorrhoids.   Uncomplicated diverticulosis (intestine condition) and irritable bowel syndrome.   Weight management.   As a protective measure against hardening of the arteries (atherosclerosis), diabetes, and cancer.   GUIDELINES FOR INCREASING FIBER IN THE DIET  Start adding fiber to the diet slowly. A gradual increase of about 5 more grams (2 slices of whole-wheat bread, 2 servings of most fruits or vegetables, or 1 bowl of high-fiber cereal) per day is best. Too rapid an  increase in fiber may result in constipation, flatulence, and bloating.   Drink enough water and fluids to keep your urine clear or pale yellow. Water, juice, or caffeine-free drinks are recommended. Not drinking enough fluid may cause constipation.   Eat a variety of high-fiber foods rather than one type of fiber.   Try to increase your intake of fiber through using high-fiber foods rather than fiber pills or supplements that contain small amounts of fiber.   The goal is to change the types of food eaten. Do not supplement your present diet with high-fiber foods, but replace foods in your present diet.   INCLUDE A VARIETY OF FIBER SOURCES  Replace refined and processed grains with whole grains, canned fruits with fresh fruits, and incorporate other fiber sources. White rice, white breads, and most bakery goods contain little or no fiber.   Brown whole-grain rice, buckwheat oats, and many fruits and vegetables are all good sources of fiber. These include: broccoli, Brussels sprouts, cabbage, cauliflower, beets, sweet potatoes, white potatoes (skin on), carrots, tomatoes, eggplant, squash, berries, fresh fruits, and dried fruits.   Cereals appear to be the richest source of fiber. Cereal fiber is found in whole grains and bran. Bran is the fiber-rich outer coat of cereal grain, which is largely removed in refining. In whole-grain cereals, the bran remains. In breakfast cereals, the largest amount of fiber is found in those with "bran" in their names. The fiber content is sometimes indicated on the label.   You may need to include additional fruits and vegetables each day.   In baking, for 1 cup white flour, you may use the following substitutions:   1 cup whole-wheat flour minus 2 tablespoons.   1/2 cup white flour plus 1/2 cup whole-wheat flour.   Polyps, Colon  A polyp is extra tissue that grows inside your body. Colon polyps grow in the large intestine. The large intestine, also called  the colon, is part of your digestive system. It is a long, hollow tube at the end of your digestive tract where your body makes and stores stool. Most polyps are not dangerous. They are benign. This means they are not cancerous. But over time, some types of polyps can turn into cancer. Polyps that are smaller than a pea are usually not harmful. But larger polyps could someday become or may already be cancerous. To be safe, doctors remove all polyps and test them.   WHO GETS POLYPS? Anyone can get polyps, but certain people are more likely than others. You may have a greater chance of getting polyps if:  You are over 50.   You have had polyps before.   Someone in your family has had polyps.   Someone in your family has had cancer of the large intestine.   Find out if someone in your family has had polyps. You may also be more likely to get polyps if you:   Eat a lot of fatty foods  Smoke   Drink alcohol   Do not exercise  Eat too much   PREVENTION There is not one sure way to prevent polyps. You might be able to lower your risk of getting them if you:  Eat more fruits and vegetables and less fatty food.   Do not smoke.   Avoid alcohol.   Exercise every day.   Lose weight if you are overweight.   Eating more calcium and folate can also lower your risk of getting polyps. Some foods that are rich in calcium are milk, cheese, and broccoli. Some foods that are rich in folate are chickpeas, kidney beans, and spinach.   Hemorrhoids Hemorrhoids are dilated (enlarged) veins around the rectum. Sometimes clots will form in the veins. This makes them swollen and painful. These are called thrombosed hemorrhoids. Causes of hemorrhoids include:  Constipation.   Straining to have a bowel movement.   HEAVY LIFTING  HOME CARE INSTRUCTIONS  Eat a well balanced diet and drink 6 to 8 glasses of water every day to avoid constipation. You may also use a bulk laxative.   Avoid straining to  have bowel movements.   Keep anal area dry and clean.   Do not use a donut shaped pillow or sit on the toilet for long periods. This increases blood pooling and pain.   Move your bowels when your body has the urge; this will require less straining and will decrease pain and pressure.

## 2017-12-28 NOTE — Op Note (Signed)
[facility] Patient Name: Devin Gross Procedure Date: 12/28/2017 11:09 AM MRN: [id number] Date of Birth: November 28, 1965 Attending MD: Barney Drain MD, MD CSN: [id number] Age: 51 Admit Type: Outpatient Procedure:                Colonoscopy WITH COLD SNARE POLYPECTOMY Indications:              Hematochezia Providers:                Barney Drain MD, MD, Rosina Lowenstein, RN, Nelma Rothman,                            Technician Referring MD:              Medicines:                Meperidine 50 mg IV, Midazolam 5 mg IV Complications:            No immediate complications. Estimated Blood Loss:     Estimated blood loss was minimal. Procedure:                Pre-Anesthesia Assessment:                           - Prior to the procedure, a History and Physical                            was performed, and patient medications and                            allergies were reviewed. The patient's tolerance of                            previous anesthesia was also reviewed. The risks                            and benefits of the procedure and the sedation                            options and risks were discussed with the patient.                            All questions were answered, and informed consent                            was obtained. Prior Anticoagulants: The patient has                            taken no previous anticoagulant or antiplatelet                            agents. ASA Grade Assessment: I - A normal, healthy                            patient. After reviewing the risks and benefits,  the patient was deemed in satisfactory condition to                            undergo the procedure. After obtaining informed                            consent, the colonoscope was passed under direct                            vision. Throughout the procedure, the patient's                            blood pressure, pulse, and oxygen saturations were                   monitored continuously. The EC-[id number] ([id number])                            scope was introduced through the anus and advanced                            to the 5 cm into the ileum. The colonoscopy was                            somewhat difficult due to a tortuous colon.                            Successful completion of the procedure was aided by                            increasing the dose of sedation medication,                            straightening and shortening the scope to obtain                            bowel loop reduction and COLOWRAP. The patient                            tolerated the procedure well. The quality of the                            bowel preparation was excellent. The terminal                            ileum, ileocecal valve, appendiceal orifice, and                            rectum were photographed. Scope In: 11:26:48 AM Scope Out: 96:75:91 AM Scope Withdrawal Time: 0 hours 17 minutes 4 seconds  Total Procedure Duration: 0 hours 19 minutes 56 seconds  Findings:      The perianal examination was normal.      The recto-sigmoid colon and sigmoid colon were moderately tortuous.      External hemorrhoids were found  during retroflexion. The hemorrhoids       were small.      Four sessile polyps were found in the mid sigmoid colon, mid transverse       colon, mid ascending colon and distal ascending colon. The polyps were 2       to 4 mm in size. These polyps were removed with a cold snare. Resection       and retrieval were complete. Impression:               - Tortuous RECTOSIGMOID colon.                           - SMALL External hemorrhoids.                           - FOUR COLON POLYPS REMOVED                           - RECTAL BLEEDING DUE TO BENIGN ANORECTAL                            SOURCE-POSSIBLE ANL FISSURE Moderate Sedation:      Moderate (conscious) sedation was administered by the endoscopy nurse       and supervised by  the endoscopist. The following parameters were       monitored: oxygen saturation, heart rate, blood pressure, and response       to care. Total physician intraservice time was 29 minutes. Recommendation:           - Await pathology results.                           - Repeat colonoscopy in 3 years for surveillance.                           - Continue present medications.                           - High fiber diet.                           - Patient has a contact number available for                            emergencies. The signs and symptoms of potential                            delayed complications were discussed with the                            patient. Return to normal activities tomorrow.                            Written discharge instructions were provided to the                            patient. Procedure Code(s):        --- Professional ---  [phone], Colonoscopy, flexible; with removal of                            tumor(s), polyp(s), or other lesion(s) by snare                            technique                           99152, Moderate sedation services provided by the                            same physician or other qualified health care                            professional performing the diagnostic or                            therapeutic service that the sedation supports,                            requiring the presence of an independent trained                            observer to assist in the monitoring of the                            patient's level of consciousness and physiological                            status; initial 15 minutes of intraservice time,                            patient age 37 years or older                           [phone], Moderate sedation services; each additional                            15 minutes intraservice time Diagnosis Code(s):        --- Professional ---                            K64.4, Residual hemorrhoidal skin tags                           K92.1, Melena (includes Hematochezia)                           Q43.8, Other specified congenital malformations of                            intestine CPT copyright 2016 [employer]. All rights reserved. The codes documented in this report are preliminary and upon coder review may  be revised to meet current compliance requirements. Barney Drain, MD Carlyon Prows  Betta Balla MD, MD 12/28/2017 11:57:20 AM This report has been signed electronically. Number of Addenda: 0

## 2017-12-28 NOTE — H&P (Signed)
Primary Care Physician:  Rennis Golden Primary Gastroenterologist:  Dr. Oneida Alar  Pre-Procedure History & Physical: HPI:  Devin Gross is a 52 y.o. male here for BRBPR.  Past Medical History:  Diagnosis Date  . Calculus of ureter 08/31/2013  . Gilbert's syndrome   . Hypercholesteremia     Past Surgical History:  Procedure Laterality Date  . EYE SURGERY      Prior to Admission medications   Medication Sig      atorvastatin (LIPITOR) 20 MG tablet TAKE 1 TABLET BY MOUTH AT BEDTIME. Patient taking differently: TAKE 1 TABLET BY MOUTH AT DAILY.             cetirizine (ZYRTEC) 10 MG tablet Take 1 tablet (10 mg total) by mouth daily. Patient taking differently: Take 10 mg by mouth daily as needed (for seasonal allergies.).         Allergies as of 11/27/2017  . (No Known Allergies)    Family History  Problem Relation Age of Onset  . Heart disease Father 24       CABG no prior hx  . Hypertension Brother   . Hypertension Maternal Grandmother   . Colon cancer Neg Hx     Social History   Socioeconomic History  . Marital status: Single    Spouse name: Not on file  . Number of children: Not on file  . Years of education: Not on file  . Highest education level: Not on file  Occupational History  . Not on file  Social Needs  . Financial resource strain: Not on file  . Food insecurity:    Worry: Not on file    Inability: Not on file  . Transportation needs:    Medical: Not on file    Non-medical: Not on file  Tobacco Use  . Smoking status: Never Smoker  . Smokeless tobacco: Never Used  Substance and Sexual Activity  . Alcohol use: No  . Drug use: No  . Sexual activity: Not on file  Lifestyle  . Physical activity:    Days per week: Not on file    Minutes per session: Not on file  . Stress: Not on file  Relationships  . Social connections:    Talks on phone: Not on file    Gets together: Not on file    Attends religious service: Not on file    Active  member of club or organization: Not on file    Attends meetings of clubs or organizations: Not on file    Relationship status: Not on file  . Intimate partner violence:    Fear of current or ex partner: Not on file    Emotionally abused: Not on file    Physically abused: Not on file    Forced sexual activity: Not on file  Other Topics Concern  . Not on file  Social History Narrative  . Not on file    Review of Systems: See HPI, otherwise negative ROS   Physical Exam: BP (!) 146/94   Pulse (!) 59   Temp 98.3 F (36.8 C) (Oral)   Resp 12   Ht 6\' 1"  (1.854 m)   Wt 160 lb (72.6 kg)   SpO2 95%   BMI 21.11 kg/m  General:   Alert,  pleasant and cooperative in NAD Head:  Normocephalic and atraumatic. Neck:  Supple; Lungs:  Clear throughout to auscultation.    Heart:  Regular rate and rhythm. Abdomen:  Soft, nontender and nondistended.  Normal bowel sounds, without guarding, and without rebound.   Neurologic:  Alert and  oriented x4;  grossly normal neurologically.  Impression/Plan:    BRBPR  PLAN: TCS TODAY DISCUSSED PROCEDURE, BENEFITS, & RISKS: < 1% chance of medication reaction, bleeding, perforation, or rupture of spleen/liver.

## 2017-12-30 ENCOUNTER — Encounter (HOSPITAL_COMMUNITY): Payer: Self-pay | Admitting: Gastroenterology

## 2017-12-31 NOTE — Progress Notes (Signed)
PT's mom, Erhard Senske, is aware of results.

## 2018-02-04 ENCOUNTER — Encounter: Payer: Self-pay | Admitting: Physician Assistant

## 2018-02-04 ENCOUNTER — Ambulatory Visit (INDEPENDENT_AMBULATORY_CARE_PROVIDER_SITE_OTHER): Payer: 59 | Admitting: Physician Assistant

## 2018-02-04 VITALS — BP 118/70 | HR 52 | Temp 97.5°F | Resp 16 | Ht 74.0 in | Wt 162.6 lb

## 2018-02-04 DIAGNOSIS — Z8249 Family history of ischemic heart disease and other diseases of the circulatory system: Secondary | ICD-10-CM

## 2018-02-04 DIAGNOSIS — R5383 Other fatigue: Secondary | ICD-10-CM | POA: Diagnosis not present

## 2018-02-04 DIAGNOSIS — E785 Hyperlipidemia, unspecified: Secondary | ICD-10-CM | POA: Diagnosis not present

## 2018-02-04 DIAGNOSIS — Z0000 Encounter for general adult medical examination without abnormal findings: Secondary | ICD-10-CM

## 2018-02-04 NOTE — Progress Notes (Signed)
Patient ID: Devin Gross MRN: [id number], DOB: Jul 12, 1966 52 y.o. Date of Encounter: 02/04/2018, 3:16 PM    Chief Complaint: Physical (CPE)  HPI: 52 y.o. y/o male here for CPE.    Today I have reviewed his last routine visit with me which includes information from his prior visits with me.  See that note for details.  That note is dated 04/02/2017.  Today he reports that everything has remained stable and that there have been no significant changes. He reports that he continues with the same job with the [employer]. He reports that he continues to live with his mom.  States that she is in pretty good health and has no significant health issues.  States that she does drive and is able to drive herself to the store etc.  He continues to take the Lipitor 20mg  as directed. This is causing no myalgias or other adverse effects.  He reports that he is fasting and has had nothing to eat but he did drink a soda at around noon but otherwise is fasting.  Currently about 3:30 PM at the time of his lab draw.  He reports that he "gets good sleep but sometimes he feels exhausted and really tired.  Doesn't know if it is his age or if he needs to take iron or what ". Says that he goes to bed around 9 PM but he does wake up around 4:30am.    Review of Systems: Consitutional: No fever, chills, fatigue, night sweats, lymphadenopathy, or weight changes. Eyes: No visual changes, eye redness, or discharge. ENT/Mouth: Ears: No otalgia, tinnitus, hearing loss, discharge. Nose: No congestion, rhinorrhea, sinus pain, or epistaxis. Throat: No sore throat, post nasal drip, or teeth pain. Cardiovascular: No CP, palpitations, diaphoresis, DOE, edema, orthopnea, PND. Respiratory: No cough, hemoptysis, SOB, or wheezing. Gastrointestinal: No anorexia, dysphagia, reflux, pain, nausea, vomiting, hematemesis, diarrhea, constipation, BRBPR, or melena. Genitourinary: No dysuria, frequency, urgency,  hematuria, incontinence, nocturia, decreased urinary stream, discharge, impotence, or testicular pain/masses. Musculoskeletal: No decreased ROM, myalgias, stiffness, joint swelling, or weakness. Skin: No rash, erythema, lesion changes, pain, warmth, jaundice, or pruritis. Neurological: No headache, dizziness, syncope, seizures, tremors, memory loss, coordination problems, or paresthesias. Psychological: No anxiety, depression, hallucinations, SI/HI. Endocrine: No fatigue, polydipsia, polyphagia, polyuria, or known diabetes. All other systems were reviewed and are otherwise negative.  Past Medical History:  Diagnosis Date  . Calculus of ureter 08/31/2013  . Gilbert's syndrome   . Hypercholesteremia      Past Surgical History:  Procedure Laterality Date  . COLONOSCOPY N/A 12/28/2017   Procedure: COLONOSCOPY;  Surgeon: Danie Binder, MD;  Location: [facility];  Service: Endoscopy;  Laterality: N/A;  12:00pm  . EYE SURGERY      Home Meds:  Outpatient Medications Prior to Visit  Medication Sig Dispense Refill  . atorvastatin (LIPITOR) 20 MG tablet TAKE 1 TABLET BY MOUTH AT BEDTIME. (Patient taking differently: TAKE 1 TABLET BY MOUTH AT DAILY.) 30 tablet 0  . cetirizine (ZYRTEC) 10 MG tablet Take 1 tablet (10 mg total) by mouth daily. (Patient taking differently: Take 10 mg by mouth daily as needed (for seasonal allergies.). ) 30 tablet 11   No facility-administered medications prior to visit.     Allergies: No Known Allergies  Social History   Socioeconomic History  . Marital status: Single    Spouse name: Not on file  . Number of children: Not on file  . Years of education: Not on file  .  Highest education level: Not on file  Occupational History  . Not on file  Social Needs  . Financial resource strain: Not on file  . Food insecurity:    Worry: Not on file    Inability: Not on file  . Transportation needs:    Medical: Not on file    Non-medical: Not on file    Tobacco Use  . Smoking status: Never Smoker  . Smokeless tobacco: Never Used  Substance and Sexual Activity  . Alcohol use: No  . Drug use: No  . Sexual activity: Not on file  Lifestyle  . Physical activity:    Days per week: Not on file    Minutes per session: Not on file  . Stress: Not on file  Relationships  . Social connections:    Talks on phone: Not on file    Gets together: Not on file    Attends religious service: Not on file    Active member of club or organization: Not on file    Attends meetings of clubs or organizations: Not on file    Relationship status: Not on file  . Intimate partner violence:    Fear of current or ex partner: Not on file    Emotionally abused: Not on file    Physically abused: Not on file    Forced sexual activity: Not on file  Other Topics Concern  . Not on file  Social History Narrative  . Not on file    Family History  Problem Relation Age of Onset  . Heart disease Father 54       CABG no prior hx  . Hypertension Brother   . Colon polyps Brother        age 70  . Hypertension Maternal Grandmother   . Colon cancer Neg Hx     Physical Exam: Blood pressure 118/70, pulse (!) 52, temperature (!) 97.5 F (36.4 C), temperature source Oral, resp. rate 16, height 6\' 2"  (1.88 m), weight 73.8 kg (162 lb 9.6 oz), SpO2 98 %.  General: Well developed, well nourished AAM. Appears in no acute distress. HEENT: Normocephalic, atraumatic. Conjunctiva pink, sclera non-icteric. Pupils 2 mm constricting to 1 mm, round, regular, and equally reactive to light and accomodation. EOMI. Internal auditory canal clear. TMs with good cone of light and without pathology. Nasal mucosa pink. Nares are without discharge. No sinus tenderness. Oral mucosa pink.  Neck: Supple. Trachea midline. No thyromegaly. Full ROM. No lymphadenopathy.  No carotid bruit. Lungs: Clear to auscultation bilaterally without wheezes, rales, or rhonchi. Breathing is of normal effort and  unlabored. Cardiovascular: RRR with S1 S2. No murmurs, rubs, or gallops. Distal pulses 2+ symmetrically. No carotid or abdominal bruits. Abdomen: Soft, non-tender, non-distended with normoactive bowel sounds. No hepatosplenomegaly or masses. No rebound/guarding. No CVA tenderness. No hernias. Musculoskeletal: Full range of motion and 5/5 strength throughout.  Skin: Warm and moist without erythema, ecchymosis, wounds, or rash. Neuro: A+Ox3. CN II-XII grossly intact. Moves all extremities spontaneously. Full sensation throughout. Normal gait.  Psych:  Responds to questions appropriately with a normal affect.   Assessment/Plan:  52 y.o. y/o  male here for CPE   -1. Encounter for preventive health examination  A. Screening Labs: - CBC with Differential/Platelet - COMPLETE METABOLIC PANEL WITH GFR - Lipid panel - TSH  B. Screening For Prostate Cancer: Last PSA was drawn 04/02/2017.  Have to wait a full year before checking next PSA.  Will check PSA with his labs at his  next visit in 6 months.  C. Screening For Colorectal Cancer:  He had colonoscopy 12/28/2017.  I see this documented in epic but I am unable to open up the report.  He states that this revealed 3 polyps and he was told to repeat in 3 years.  D. Immunizations: Flu-------------------------N/A Tetanus------------------10/23/2012 Pneumococcal--------- He has no indication to require pneumonia vaccine until age 18 Shingrix------------------ I discussed this vaccine with him today.  Explained shingles and discussed the vaccine.  He is to check coverage/cost with his insurance plan and then if he is wants to proceed with the immunization will get this at the pharmacy.  He voices understanding and agrees.   2. Fatigue, unspecified type I explained that I will check labs to evaluate for underlying causes of increased fatigue.  Discussed that if these labs do come back normal, that his fatigue is likely secondary to age. - CBC with  Differential/Platelet - TSH  3. Hyperlipidemia, unspecified hyperlipidemia type He Is on Lipitor.  Recheck FLP/LFT to monitor.  4. Family history of premature coronary artery disease                     Signed:   [address] [place], [place]  02/04/2018 3:16 PM

## 2018-02-05 LAB — COMPLETE METABOLIC PANEL WITH GFR
AG RATIO: 1.8 (calc) (ref 1.0–2.5)
ALBUMIN MSPROF: 4.7 g/dL (ref 3.6–5.1)
ALKALINE PHOSPHATASE (APISO): 66 U/L (ref 40–115)
ALT: 15 U/L (ref 9–46)
AST: 18 U/L (ref 10–35)
BILIRUBIN TOTAL: 2 mg/dL — AB (ref 0.2–1.2)
BUN: 11 mg/dL (ref 7–25)
CO2: 27 mmol/L (ref 20–32)
Calcium: 9.6 mg/dL (ref 8.6–10.3)
Chloride: 106 mmol/L (ref 98–110)
Creat: 0.86 mg/dL (ref 0.70–1.33)
GFR, EST AFRICAN AMERICAN: 117 mL/min/{1.73_m2} (ref >=60)
GFR, Est Non African American: 101 mL/min/{1.73_m2} (ref >=60)
GLUCOSE: 81 mg/dL (ref 65–99)
Globulin: 2.6 g/dL (calc) (ref 1.9–3.7)
POTASSIUM: 3.7 mmol/L (ref 3.5–5.3)
SODIUM: 142 mmol/L (ref 135–146)
TOTAL PROTEIN: 7.3 g/dL (ref 6.1–8.1)

## 2018-02-05 LAB — LIPID PANEL
CHOLESTEROL: 171 mg/dL (ref <200)
HDL: 52 mg/dL (ref >40)
LDL CHOLESTEROL (CALC): 101 mg/dL — AB
Non-HDL Cholesterol (Calc): 119 mg/dL (calc) (ref <130)
Total CHOL/HDL Ratio: 3.3 (calc) (ref <5.0)
Triglycerides: 87 mg/dL (ref <150)

## 2018-02-05 LAB — CBC WITH DIFFERENTIAL/PLATELET
BASOS ABS: 30 {cells}/uL (ref 0–200)
Basophils Relative: 0.7 %
Eosinophils Absolute: 82 cells/uL (ref 15–500)
Eosinophils Relative: 1.9 %
HCT: 40.8 % (ref 38.5–50.0)
HEMOGLOBIN: 13.4 g/dL (ref 13.2–17.1)
Lymphs Abs: 1772 cells/uL (ref 850–3900)
MCH: 27.1 pg (ref 27.0–33.0)
MCHC: 32.8 g/dL (ref 32.0–36.0)
MCV: 82.4 fL (ref 80.0–100.0)
MONOS PCT: 8 %
MPV: 10.7 fL (ref 7.5–12.5)
Neutro Abs: 2073 cells/uL (ref 1500–7800)
Neutrophils Relative %: 48.2 %
PLATELETS: 245 10*3/uL (ref 140–400)
RBC: 4.95 10*6/uL (ref 4.20–5.80)
RDW: 12.9 % (ref 11.0–15.0)
TOTAL LYMPHOCYTE: 41.2 %
WBC: 4.3 10*3/uL (ref 3.8–10.8)
WBCMIX: 344 {cells}/uL (ref 200–950)

## 2018-02-05 LAB — TSH: TSH: 1.09 m[IU]/L (ref 0.40–4.50)

## 2018-02-21 ENCOUNTER — Other Ambulatory Visit: Payer: Self-pay | Admitting: Physician Assistant

## 2018-05-30 ENCOUNTER — Other Ambulatory Visit: Payer: Self-pay | Admitting: Physician Assistant

## 2018-10-29 DIAGNOSIS — J329 Chronic sinusitis, unspecified: Secondary | ICD-10-CM | POA: Diagnosis not present

## 2018-11-10 ENCOUNTER — Other Ambulatory Visit: Payer: Self-pay

## 2018-11-10 ENCOUNTER — Ambulatory Visit (INDEPENDENT_AMBULATORY_CARE_PROVIDER_SITE_OTHER): Payer: 59 | Admitting: Family Medicine

## 2018-11-10 ENCOUNTER — Encounter: Payer: Self-pay | Admitting: Family Medicine

## 2018-11-10 VITALS — BP 120/62 | HR 60 | Temp 98.4°F | Resp 14 | Ht 74.0 in | Wt 172.0 lb

## 2018-11-10 DIAGNOSIS — Z8249 Family history of ischemic heart disease and other diseases of the circulatory system: Secondary | ICD-10-CM | POA: Diagnosis not present

## 2018-11-10 DIAGNOSIS — E785 Hyperlipidemia, unspecified: Secondary | ICD-10-CM

## 2018-11-10 DIAGNOSIS — Z0000 Encounter for general adult medical examination without abnormal findings: Secondary | ICD-10-CM | POA: Diagnosis not present

## 2018-11-10 DIAGNOSIS — Z125 Encounter for screening for malignant neoplasm of prostate: Secondary | ICD-10-CM | POA: Diagnosis not present

## 2018-11-10 MED ORDER — ZOSTER VAC RECOMB ADJUVANTED 50 MCG/0.5ML IM SUSR: 0.5000 mL | Freq: Once | INTRAMUSCULAR | 1 refills | Status: AC

## 2018-11-10 MED ORDER — ATORVASTATIN CALCIUM 20 MG PO TABS: 10.0000 mg | ORAL_TABLET | Freq: Every day | ORAL | 2 refills | Status: DC

## 2018-11-10 NOTE — Patient Instructions (Signed)
F/U 1 year for physical Shingles vaccine sent to pharmacy Take Mucinex DM 12 hour

## 2018-11-10 NOTE — Progress Notes (Signed)
   Subjective:    Patient ID: Devin Gross, male    DOB: 10-11-1965, 53 y.o.   MRN: [id number]  Patient presents for Annual Exam (is fasting)  Pt here for CPE, mediations and history reviewed Eye doctor- [facility] Hyperlipidemia- taking lipitor10mg  at bedtime , he cut it down after last visit in May 2019   - family history CHF/ HTN on paternal side   Colonoscopy done in March 2019 - Dr. Oneida Alar    Seen at [facility] 1/23 Dr. Jomarie Longs, had sinusitis treated with sinusitis and flonase, feels rattleing in his chest, non productive cough, no fever   Immunizations- TDAP, Influenza UTD      Due for PSA screening   Review Of Systems:  GEN- denies fatigue, fever, weight loss,weakness, recent illness HEENT- denies eye drainage, change in vision, nasal discharge, CVS- denies chest pain, palpitations RESP- denies SOB, cough, wheeze ABD- denies N/V, change in stools, abd pain GU- denies dysuria, hematuria, dribbling, incontinence MSK- denies joint pain, muscle aches, injury Neuro- denies headache, dizziness, syncope, seizure activity       Objective:    BP 120/62   Pulse 60   Temp 98.4 F (36.9 C) (Oral)   Resp 14   Ht 6\' 2"  (1.88 m)   Wt 172 lb (78 kg)   SpO2 100%   BMI 22.08 kg/m  GEN- NAD, alert and oriented x3 HEENT- PERRL, EOMI, non injected sclera, pink conjunctiva, MMM, oropharynx clear Neck- Supple, no thyromegaly CVS- RRR, no murmur RESP-CTAB ABD-NABS,soft,NT,ND EXT- No edema Pulses- Radial, DP- 2+        Assessment & Plan:     He will finish sinusitis medications, can add mucinex DM for congestion/cough Problem List Items Addressed This Visit      Unprioritized   Family history of premature coronary artery disease   Hyperlipidemia   Relevant Medications   atorvastatin (LIPITOR) 20 MG tablet   Other Relevant Orders   Lipid panel (Completed)   Prostate cancer screening   Relevant Orders   PSA (Completed)    Other Visit Diagnoses    Routine  general medical examination at a health care facility    -  Primary   CPE done, family history reviewed, recheck fasting labs, continue liptor 10mg  for now, discussed PSA screenign, shingles to pharmacy    Relevant Orders   CBC with Differential/Platelet (Completed)   Comprehensive metabolic panel (Completed)   HIV Antibody (routine testing w rflx) (Completed)      Note: This dictation was prepared with Dragon dictation along with smaller phrase technology. Any transcriptional errors that result from this process are unintentional.

## 2018-11-11 ENCOUNTER — Encounter: Payer: Self-pay | Admitting: Family Medicine

## 2018-11-11 LAB — COMPREHENSIVE METABOLIC PANEL
AG Ratio: 2.1 (calc) (ref 1.0–2.5)
ALBUMIN MSPROF: 4.9 g/dL (ref 3.6–5.1)
ALT: 58 U/L — ABNORMAL HIGH (ref 9–46)
AST: 47 U/L — AB (ref 10–35)
Alkaline phosphatase (APISO): 76 U/L (ref 35–144)
BUN: 7 mg/dL (ref 7–25)
CHLORIDE: 104 mmol/L (ref 98–110)
CO2: 24 mmol/L (ref 20–32)
Calcium: 9.5 mg/dL (ref 8.6–10.3)
Creat: 0.87 mg/dL (ref 0.70–1.33)
GLOBULIN: 2.3 g/dL (ref 1.9–3.7)
Glucose, Bld: 87 mg/dL (ref 65–99)
POTASSIUM: 4 mmol/L (ref 3.5–5.3)
Sodium: 141 mmol/L (ref 135–146)
TOTAL PROTEIN: 7.2 g/dL (ref 6.1–8.1)
Total Bilirubin: 1.6 mg/dL — ABNORMAL HIGH (ref 0.2–1.2)

## 2018-11-11 LAB — CBC WITH DIFFERENTIAL/PLATELET
ABSOLUTE MONOCYTES: 418 {cells}/uL (ref 200–950)
BASOS PCT: 0.8 %
Basophils Absolute: 38 cells/uL (ref 0–200)
EOS ABS: 118 {cells}/uL (ref 15–500)
EOS PCT: 2.5 %
HCT: 43.6 % (ref 38.5–50.0)
Hemoglobin: 14.5 g/dL (ref 13.2–17.1)
LYMPHS ABS: 1927 {cells}/uL (ref 850–3900)
MCH: 27.8 pg (ref 27.0–33.0)
MCHC: 33.3 g/dL (ref 32.0–36.0)
MCV: 83.5 fL (ref 80.0–100.0)
MPV: 10.6 fL (ref 7.5–12.5)
Monocytes Relative: 8.9 %
Neutro Abs: 2200 cells/uL (ref 1500–7800)
Neutrophils Relative %: 46.8 %
PLATELETS: 254 10*3/uL (ref 140–400)
RBC: 5.22 10*6/uL (ref 4.20–5.80)
RDW: 12.8 % (ref 11.0–15.0)
Total Lymphocyte: 41 %
WBC: 4.7 10*3/uL (ref 3.8–10.8)

## 2018-11-11 LAB — LIPID PANEL
Cholesterol: 202 mg/dL — ABNORMAL HIGH (ref <200)
HDL: 55 mg/dL (ref >=40)
LDL Cholesterol (Calc): 123 mg/dL (calc) — ABNORMAL HIGH
Non-HDL Cholesterol (Calc): 147 mg/dL (calc) — ABNORMAL HIGH (ref <130)
Total CHOL/HDL Ratio: 3.7 (calc) (ref <5.0)
Triglycerides: 125 mg/dL (ref <150)

## 2018-11-11 LAB — PSA: PSA: 1.4 ng/mL (ref <=4.0)

## 2018-11-11 LAB — HIV ANTIBODY (ROUTINE TESTING W REFLEX): HIV 1&2 Ab, 4th Generation: NONREACTIVE

## 2018-11-15 ENCOUNTER — Other Ambulatory Visit: Payer: Self-pay | Admitting: *Deleted

## 2018-11-15 DIAGNOSIS — R7989 Other specified abnormal findings of blood chemistry: Secondary | ICD-10-CM

## 2018-11-15 DIAGNOSIS — R945 Abnormal results of liver function studies: Principal | ICD-10-CM

## 2018-11-17 ENCOUNTER — Other Ambulatory Visit: Payer: 59

## 2018-11-17 DIAGNOSIS — R945 Abnormal results of liver function studies: Principal | ICD-10-CM

## 2018-11-17 DIAGNOSIS — R7989 Other specified abnormal findings of blood chemistry: Secondary | ICD-10-CM

## 2018-11-18 LAB — COMPLETE METABOLIC PANEL WITH GFR
AG Ratio: 1.8 (calc) (ref 1.0–2.5)
ALT: 69 U/L — ABNORMAL HIGH (ref 9–46)
AST: 44 U/L — ABNORMAL HIGH (ref 10–35)
Albumin: 4.3 g/dL (ref 3.6–5.1)
Alkaline phosphatase (APISO): 69 U/L (ref 35–144)
BILIRUBIN TOTAL: 1.6 mg/dL — AB (ref 0.2–1.2)
BUN: 13 mg/dL (ref 7–25)
CHLORIDE: 107 mmol/L (ref 98–110)
CO2: 25 mmol/L (ref 20–32)
CREATININE: 0.95 mg/dL (ref 0.70–1.33)
Calcium: 9.3 mg/dL (ref 8.6–10.3)
GFR, EST AFRICAN AMERICAN: 107 mL/min/{1.73_m2} (ref >=60)
GFR, Est Non African American: 92 mL/min/{1.73_m2} (ref >=60)
GLUCOSE: 84 mg/dL (ref 65–99)
Globulin: 2.4 g/dL (calc) (ref 1.9–3.7)
Potassium: 3.9 mmol/L (ref 3.5–5.3)
Sodium: 143 mmol/L (ref 135–146)
TOTAL PROTEIN: 6.7 g/dL (ref 6.1–8.1)

## 2018-11-23 ENCOUNTER — Other Ambulatory Visit: Payer: Self-pay

## 2018-11-23 MED ORDER — ATORVASTATIN CALCIUM 20 MG PO TABS: 10.0000 mg | ORAL_TABLET | Freq: Every day | ORAL | 2 refills | Status: DC

## 2018-12-14 ENCOUNTER — Ambulatory Visit (INDEPENDENT_AMBULATORY_CARE_PROVIDER_SITE_OTHER): Payer: 59 | Admitting: Family Medicine

## 2018-12-14 ENCOUNTER — Encounter: Payer: Self-pay | Admitting: Family Medicine

## 2018-12-14 ENCOUNTER — Other Ambulatory Visit: Payer: Self-pay

## 2018-12-14 VITALS — BP 138/68 | HR 74 | Temp 98.5°F | Resp 14 | Ht 74.0 in | Wt 168.0 lb

## 2018-12-14 DIAGNOSIS — R945 Abnormal results of liver function studies: Secondary | ICD-10-CM | POA: Diagnosis not present

## 2018-12-14 DIAGNOSIS — R7989 Other specified abnormal findings of blood chemistry: Secondary | ICD-10-CM

## 2018-12-14 NOTE — Progress Notes (Signed)
   Subjective:    Patient ID: Devin Gross, male    DOB: 05-15-1966, 53 y.o.   MRN: [id number]  Patient presents for F/U LFT  Pt here to f/u LFT, seen for CPE, has history of hyperlipidemia AST was  47  ALT  58  2 weeks later AST 44 and ALT 69  Denies ETOH, but was using a lot of OTC cough and congestion meds, some with acetaminophen  feels well He is holding lipitor as discussed for the past 2 weeks   Review Of Systems:  GEN- denies fatigue, fever, weight loss,weakness, recent illness HEENT- denies eye drainage, change in vision, nasal discharge, CVS- denies chest pain, palpitations RESP- denies SOB, cough, wheeze ABD- denies N/V, change in stools, abd pain GU- denies dysuria, hematuria, dribbling, incontinence MSK- denies joint pain, muscle aches, injury Neuro- denies headache, dizziness, syncope, seizure activity       Objective:    BP 138/68   Pulse 74   Temp 98.5 F (36.9 C) (Oral)   Resp 14   Ht 6\' 2"  (1.88 m)   Wt 168 lb (76.2 kg)   SpO2 97%   BMI 21.57 kg/m  GEN- NAD, alert and oriented x3 HEENT- PERRL, EOMI, non injected sclera, pink conjunctiva, MMM, oropharynx clear, non icteric CVS- RRR, no murmur RESP-CTAB ABD-NABS,soft,NT,ND, no HSM EXT- No edema Pulses- Radial  2+        Assessment & Plan:      Problem List Items Addressed This Visit    None    Visit Diagnoses    Elevated liver function tests    -  Primary   DD 2/2 acetaminophen or lipitor or 2/2 fatty liver disease, 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 if labs are not improving will get US liver    Relevant Orders   Hepatic Function Panel   Gamma GT   Hepatitis panel, acute      Note: This dictation was prepared with Dragon dictation along with smaller phrase technology. Any transcriptional errors that result from this process are unintentional.

## 2018-12-14 NOTE — Patient Instructions (Signed)
We will call with lab results F/U pending results  

## 2018-12-15 ENCOUNTER — Other Ambulatory Visit: Payer: Self-pay | Admitting: *Deleted

## 2018-12-15 DIAGNOSIS — R945 Abnormal results of liver function studies: Principal | ICD-10-CM

## 2018-12-15 DIAGNOSIS — R7989 Other specified abnormal findings of blood chemistry: Secondary | ICD-10-CM

## 2018-12-15 LAB — HEPATITIS PANEL, ACUTE
HEP A IGM: NONREACTIVE
HEP C AB: NONREACTIVE
Hep B C IgM: NONREACTIVE
Hepatitis B Surface Ag: NONREACTIVE
SIGNAL TO CUT-OFF: 0.02 (ref <1.00)

## 2018-12-15 LAB — GAMMA GT: GGT: 47 U/L (ref 3–95)

## 2018-12-15 LAB — HEPATIC FUNCTION PANEL
AG RATIO: 1.8 (calc) (ref 1.0–2.5)
ALKALINE PHOSPHATASE (APISO): 59 U/L (ref 35–144)
ALT: 14 U/L (ref 9–46)
AST: 19 U/L (ref 10–35)
Albumin: 4.6 g/dL (ref 3.6–5.1)
BILIRUBIN DIRECT: 0.2 mg/dL (ref 0.0–0.2)
BILIRUBIN TOTAL: 1.6 mg/dL — AB (ref 0.2–1.2)
Globulin: 2.5 g/dL (calc) (ref 1.9–3.7)
Indirect Bilirubin: 1.4 mg/dL (calc) — ABNORMAL HIGH (ref 0.2–1.2)
TOTAL PROTEIN: 7.1 g/dL (ref 6.1–8.1)

## 2019-01-24 ENCOUNTER — Other Ambulatory Visit: Payer: 59

## 2019-01-24 ENCOUNTER — Other Ambulatory Visit: Payer: Self-pay

## 2019-01-24 DIAGNOSIS — R945 Abnormal results of liver function studies: Secondary | ICD-10-CM | POA: Diagnosis not present

## 2019-01-24 DIAGNOSIS — R7989 Other specified abnormal findings of blood chemistry: Secondary | ICD-10-CM

## 2019-01-24 LAB — COMPLETE METABOLIC PANEL WITH GFR
AG Ratio: 2 (calc) (ref 1.0–2.5)
ALT: 38 U/L (ref 9–46)
AST: 32 U/L (ref 10–35)
Albumin: 4.7 g/dL (ref 3.6–5.1)
Alkaline phosphatase (APISO): 69 U/L (ref 35–144)
BUN: 11 mg/dL (ref 7–25)
CO2: 29 mmol/L (ref 20–32)
Calcium: 9.7 mg/dL (ref 8.6–10.3)
Chloride: 104 mmol/L (ref 98–110)
Creat: 0.83 mg/dL (ref 0.70–1.33)
GFR, Est African American: 118 mL/min/{1.73_m2} (ref >=60)
GFR, Est Non African American: 102 mL/min/{1.73_m2} (ref >=60)
Globulin: 2.4 g/dL (calc) (ref 1.9–3.7)
Glucose, Bld: 94 mg/dL (ref 65–99)
Potassium: 4 mmol/L (ref 3.5–5.3)
Sodium: 141 mmol/L (ref 135–146)
Total Bilirubin: 1.9 mg/dL — ABNORMAL HIGH (ref 0.2–1.2)
Total Protein: 7.1 g/dL (ref 6.1–8.1)

## 2019-01-24 LAB — EXTRA LAV TOP TUBE

## 2019-03-15 ENCOUNTER — Other Ambulatory Visit: Payer: Self-pay | Admitting: Family Medicine

## 2019-10-28 ENCOUNTER — Other Ambulatory Visit: Payer: 59

## 2019-11-02 ENCOUNTER — Other Ambulatory Visit: Payer: Self-pay

## 2019-11-02 ENCOUNTER — Ambulatory Visit: Payer: 59 | Attending: Internal Medicine

## 2019-11-02 ENCOUNTER — Encounter: Payer: Self-pay | Admitting: Family Medicine

## 2019-11-02 ENCOUNTER — Ambulatory Visit: Payer: 59 | Admitting: Family Medicine

## 2019-11-02 VITALS — BP 130/74 | HR 68 | Temp 97.9°F | Resp 14 | Ht 74.0 in | Wt 171.0 lb

## 2019-11-02 DIAGNOSIS — Z20822 Contact with and (suspected) exposure to covid-19: Secondary | ICD-10-CM

## 2019-11-02 DIAGNOSIS — M545 Low back pain, unspecified: Secondary | ICD-10-CM

## 2019-11-02 MED ORDER — MELOXICAM 15 MG PO TABS: 15.0000 mg | ORAL_TABLET | Freq: Every day | ORAL | 0 refills | Status: DC

## 2019-11-02 MED ORDER — CYCLOBENZAPRINE HCL 5 MG PO TABS: 5.0000 mg | ORAL_TABLET | Freq: Two times a day (BID) | ORAL | 1 refills | Status: DC | PRN

## 2019-11-02 NOTE — Progress Notes (Signed)
   Subjective:    Patient ID: Devin Gross, male    DOB: 04/01/1966, 54 y.o.   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  He had difficulty walking at first but that has eased up.  He denies any radiating pain down the legs denies any tingling or numbness in his lower extremities.  His pain is not as severe but he notes it still lingering so he came in to be examined.  He still gets some mild spasm randomly.  He is able to sleep without any difficulty.  He does notice that he is more stiff after he is sitting for long periods of time or standing.  Is been no change in bowel or bladder.  He did try ibuprofen which helps he also use topicals of course wonderment and icy hot    Review Of Systems:  GEN- denies fatigue, fever, weight loss,weakness, recent illness HEENT- denies eye drainage, change in vision, nasal discharge, CVS- denies chest pain, palpitations RESP- denies SOB, cough, wheeze ABD- denies N/V, change in stools, abd pain GU- denies dysuria, hematuria, dribbling, incontinence MSK- + joint pain, muscle aches, injury Neuro- denies headache, dizziness, syncope, seizure activity       Objective:    BP 130/74   Pulse 68   Temp 97.9 F (36.6 C) (Temporal)   Resp 14   Ht 6\' 2"  (1.88 m)   Wt 171 lb (77.6 kg)   SpO2 96%   BMI 21.96 kg/m  GEN- NAD, alert and oriented x3 CVS- RRR, no murmur RESP-CTAB Musculoskeletal-tender to palpation lower lumbar spine L5-S1 region good range of motion.  Negative straight leg raise Neuro normal tone and lower extremities sensation grossly intact DTR symmetric nonantalgic gait EXT- No edema Pulses-  Radial,2+        Assessment & Plan:      Problem List Items Addressed This Visit    None    Visit Diagnoses    Acute bilateral low back pain without sciatica    -  Primary   No red flags on exam.  Already improved.  Likely strained something probably causing the spasm with a heavy lifting.  He has not give me any impingement signs today.  We will hold off on any imaging.  I have given him meloxicam to take daily for 2 weeks he can also use Flexeril when he gets home from work.  Advised him this typically takes 4 to 6 weeks to heal completely.  No heavy lifting.  He will call back if he does have worsening pain or he feels like he injures himself further   Relevant Medications   ibuprofen (ADVIL) 200 MG tablet   meloxicam (MOBIC) 15 MG tablet   cyclobenzaprine (FLEXERIL) 5 MG tablet      Note: This dictation was prepared with Dragon dictation along with smaller phrase technology. Any transcriptional errors that result from this process are unintentional.

## 2019-11-02 NOTE — Patient Instructions (Addendum)
Take anti-inflammatory for 2 weeks  Take muscle relaxer as needed F/U schedule for Physical for March

## 2019-11-03 ENCOUNTER — Telehealth: Payer: Self-pay

## 2019-11-03 LAB — NOVEL CORONAVIRUS, NAA: SARS-CoV-2, NAA: NOT DETECTED

## 2019-11-03 NOTE — Telephone Encounter (Signed)
Patient called in requesting [facility] lab results - DOB/Address verified - Negative results given, no further questions.

## 2020-01-31 ENCOUNTER — Ambulatory Visit (INDEPENDENT_AMBULATORY_CARE_PROVIDER_SITE_OTHER): Payer: 59 | Admitting: Family Medicine

## 2020-01-31 ENCOUNTER — Encounter: Payer: Self-pay | Admitting: Family Medicine

## 2020-01-31 ENCOUNTER — Other Ambulatory Visit: Payer: Self-pay

## 2020-01-31 VITALS — BP 128/64 | HR 94 | Temp 98.5°F | Resp 14 | Ht 74.0 in | Wt 171.0 lb

## 2020-01-31 DIAGNOSIS — Z0000 Encounter for general adult medical examination without abnormal findings: Secondary | ICD-10-CM

## 2020-01-31 DIAGNOSIS — Z125 Encounter for screening for malignant neoplasm of prostate: Secondary | ICD-10-CM

## 2020-01-31 DIAGNOSIS — E785 Hyperlipidemia, unspecified: Secondary | ICD-10-CM

## 2020-01-31 DIAGNOSIS — Z0001 Encounter for general adult medical examination with abnormal findings: Secondary | ICD-10-CM

## 2020-01-31 DIAGNOSIS — M545 Low back pain, unspecified: Secondary | ICD-10-CM

## 2020-01-31 DIAGNOSIS — G8929 Other chronic pain: Secondary | ICD-10-CM

## 2020-01-31 NOTE — Assessment & Plan Note (Signed)
Recheck lipids, LFT On statin drug

## 2020-01-31 NOTE — Patient Instructions (Addendum)
Get the xray done of the spine  At [facility] between 8am-5pm  Go to radiology department  F/U 1 year for Physical

## 2020-01-31 NOTE — Progress Notes (Signed)
   Subjective:    Patient ID: Devin Gross, male    DOB: 10/19/1965, 54 y.o.   MRN: [id number]  Patient presents for Annual Exam (is fasting) Patient here for complete physical exam.  Medications reviewed. Hyperlipidemia he is taking Lipitor 20 mg at bedtime also has family history of heart disease.  Colonoscopy up-to-date 2019  Immunizations tetanus up-to-date, COVID-19 vaccine UTD finished 10 days ago    Discussed PSA screening Due for fasting labs  He continues to have some back pain on and off but not severe.  He has not required any medications for the past few weeks but he is concerned about the underlying issue would like to have imaging done.  No change in bowel or bladder no new tingling or numbness in lower extremities.    Follows with eye doctor- has readers   Dentist- follows every 6 months   Review Of Systems:  GEN- denies fatigue, fever, weight loss,weakness, recent illness HEENT- denies eye drainage, change in vision, nasal discharge, CVS- denies chest pain, palpitations RESP- denies SOB, cough, wheeze ABD- denies N/V, change in stools, abd pain GU- denies dysuria, hematuria, dribbling, incontinence MSK- + joint pain, muscle aches, injury Neuro- denies headache, dizziness, syncope, seizure activity       Objective:    BP 128/64   Pulse 94   Temp 98.5 F (36.9 C) (Temporal)   Resp 14   Ht 6\' 2"  (1.88 m)   Wt 171 lb (77.6 kg)   SpO2 99%   BMI 21.96 kg/m  GEN- NAD, alert and oriented x3, HEENT- PERRL, EOMI, non injected sclera, pink conjunctiva, MMM, oropharynx clear, TM clear no effusion, nares clear  Neck- Supple, no thyromegaly CVS- RRR, no murmur RESP-CTAB ABD-NABS,soft,NT,ND MSK- good ROM, spine NT, moving LE equally  EXT- No edema Pulses- Radial, DP- 2+   FALL/CAGE/ Depression screen negative      Assessment & Plan:      Problem List Items Addressed This Visit      Unprioritized   Hyperlipidemia    Recheck lipids, LFT On  statin drug       Relevant Orders   Lipid panel   Prostate cancer screening   Relevant Orders   PSA    Other Visit Diagnoses    Routine general medical examination at a health care facility    -  Primary   CPE done.  Prostate cancer screening to be done.  Immunizations up-to-date, hold on shingles for now with recent COVID-19 vaccine   Relevant Orders   CBC with Differential/Platelet   Comprehensive metabolic panel   Lipid panel   Chronic bilateral low back pain without sciatica       Obtain xray of spine, currently without pain    Relevant Orders   DG Lumbar Spine Complete      Note: This dictation was prepared with Dragon dictation along with smaller phrase technology. Any transcriptional errors that result from this process are unintentional.

## 2020-02-01 LAB — LIPID PANEL
Cholesterol: 184 mg/dL (ref <200)
HDL: 52 mg/dL (ref >=40)
LDL Cholesterol (Calc): 111 mg/dL (calc) — ABNORMAL HIGH
Non-HDL Cholesterol (Calc): 132 mg/dL (calc) — ABNORMAL HIGH (ref <130)
Total CHOL/HDL Ratio: 3.5 (calc) (ref <5.0)
Triglycerides: 105 mg/dL (ref <150)

## 2020-02-01 LAB — COMPREHENSIVE METABOLIC PANEL
AG Ratio: 2.2 (calc) (ref 1.0–2.5)
ALT: 24 U/L (ref 9–46)
AST: 24 U/L (ref 10–35)
Albumin: 5 g/dL (ref 3.6–5.1)
Alkaline phosphatase (APISO): 67 U/L (ref 35–144)
BUN: 11 mg/dL (ref 7–25)
CO2: 25 mmol/L (ref 20–32)
Calcium: 10 mg/dL (ref 8.6–10.3)
Chloride: 107 mmol/L (ref 98–110)
Creat: 0.86 mg/dL (ref 0.70–1.33)
Globulin: 2.3 g/dL (calc) (ref 1.9–3.7)
Glucose, Bld: 85 mg/dL (ref 65–99)
Potassium: 3.7 mmol/L (ref 3.5–5.3)
Sodium: 142 mmol/L (ref 135–146)
Total Bilirubin: 2 mg/dL — ABNORMAL HIGH (ref 0.2–1.2)
Total Protein: 7.3 g/dL (ref 6.1–8.1)

## 2020-02-01 LAB — CBC WITH DIFFERENTIAL/PLATELET
Absolute Monocytes: 380 cells/uL (ref 200–950)
Basophils Absolute: 31 cells/uL (ref 0–200)
Basophils Relative: 0.6 %
Eosinophils Absolute: 109 cells/uL (ref 15–500)
Eosinophils Relative: 2.1 %
HCT: 44 % (ref 38.5–50.0)
Hemoglobin: 14.1 g/dL (ref 13.2–17.1)
Lymphs Abs: 2122 cells/uL (ref 850–3900)
MCH: 27.1 pg (ref 27.0–33.0)
MCHC: 32 g/dL (ref 32.0–36.0)
MCV: 84.5 fL (ref 80.0–100.0)
MPV: 10.6 fL (ref 7.5–12.5)
Monocytes Relative: 7.3 %
Neutro Abs: 2558 cells/uL (ref 1500–7800)
Neutrophils Relative %: 49.2 %
Platelets: 220 10*3/uL (ref 140–400)
RBC: 5.21 10*6/uL (ref 4.20–5.80)
RDW: 12.9 % (ref 11.0–15.0)
Total Lymphocyte: 40.8 %
WBC: 5.2 10*3/uL (ref 3.8–10.8)

## 2020-02-01 LAB — PSA: PSA: 1.4 ng/mL (ref <=4.0)

## 2020-02-03 ENCOUNTER — Encounter: Payer: Self-pay | Admitting: *Deleted

## 2020-02-13 ENCOUNTER — Ambulatory Visit (HOSPITAL_COMMUNITY)
Admission: RE | Admit: 2020-02-13 | Discharge: 2020-02-13 | Disposition: A | Discharge status: Home / Self Care | Payer: 59 | Source: Ambulatory Visit | Attending: Family Medicine | Admitting: Family Medicine

## 2020-02-13 ENCOUNTER — Other Ambulatory Visit: Payer: Self-pay

## 2020-02-13 DIAGNOSIS — G8929 Other chronic pain: Secondary | ICD-10-CM | POA: Insufficient documentation

## 2020-02-13 DIAGNOSIS — M545 Low back pain, unspecified: Secondary | ICD-10-CM

## 2020-02-15 ENCOUNTER — Other Ambulatory Visit: Payer: Self-pay | Admitting: *Deleted

## 2020-02-15 DIAGNOSIS — G8929 Other chronic pain: Secondary | ICD-10-CM

## 2020-02-27 ENCOUNTER — Encounter (HOSPITAL_COMMUNITY): Payer: Self-pay | Admitting: Physical Therapy

## 2020-02-27 ENCOUNTER — Other Ambulatory Visit: Payer: Self-pay

## 2020-02-27 ENCOUNTER — Ambulatory Visit (HOSPITAL_COMMUNITY): Payer: 59 | Attending: Family Medicine | Admitting: Physical Therapy

## 2020-02-27 DIAGNOSIS — M545 Low back pain, unspecified: Secondary | ICD-10-CM

## 2020-02-27 DIAGNOSIS — M6281 Muscle weakness (generalized): Secondary | ICD-10-CM | POA: Diagnosis present

## 2020-02-27 NOTE — Therapy (Addendum)
[place], [place], [address] Phone: [phone]   Fax:  [phone]  Physical Therapy Evaluation  Patient Details  Name: Devin Gross MRN: [id number] Date of Birth: [birth year] Referring Provider (PT): Vic Blackbird, MD   Encounter Date: 02/27/2020  PT End of Session - 02/27/20 1637    Visit Number  1    Number of Visits  8    Date for PT Re-Evaluation  03/26/20    Authorization Type  [employer]    Authorization - Visit Number  1    Authorization - Number of Visits  60    Progress Note Due on Visit  8    PT Start Time  [id number]    PT Stop Time  1515    PT Time Calculation (min)  38 min    Activity Tolerance  Patient tolerated treatment well    Behavior During Therapy  [facility] for tasks assessed/performed       Past Medical History:  Diagnosis Date  . Calculus of ureter 08/31/2013  . Gilbert's syndrome   . Hypercholesteremia     Past Surgical History:  Procedure Laterality Date  . COLONOSCOPY N/A 12/28/2017   Procedure: COLONOSCOPY;  Surgeon: Danie Binder, MD;  [facility];  Service: Endoscopy;  Laterality: N/A;  12:00pm  . EYE SURGERY      There were no vitals filed for this visit.   Subjective Assessment - 02/27/20 1446    Subjective  Patient reported that his pain started back in February 2021. Patient stated that he experienced a sharp pain in his low back. He stated that he took a medication and that this helped for a while and has not been much of a problem. He reported that 2-3 weeks ago it flared up again. Reported that the pain does not radiate down his leg that it stays in his low back/groin area. Patient reported that the pain comes and goes. 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 He said that the pain might be worse when he is setting. He said that when he stands up and stretches it will feel better at times. Denied any changes in bowel or bladder function. Patient  reports a maximum of 8/10 pain currently.    Pertinent History  LBP of insidious onset    Limitations  Sitting;House hold activities    Diagnostic tests  X-ray lumbar spine - Negative    Patient Stated Goals  To have less pain    Currently in Pain?  Yes    Pain Score  2     Pain Location  Back    Pain Orientation  Lower    Pain Descriptors / Indicators  Aching    Pain Type  Acute pain    Pain Onset  1 to 4 weeks ago    Pain Frequency  Intermittent    Aggravating Factors   Sitting    Pain Relieving Factors  Stretching         [facility] PT Assessment - 02/27/20 0001      Assessment   Medical Diagnosis  Chronic bilateral Low back pain without sciatica    Referring Provider (PT)  Vic Blackbird, MD    Onset Date/Surgical Date  --   February 2021   Next MD Visit  Unknown    Prior Therapy  None      Precautions   Precautions  None      Restrictions  Weight Bearing Restrictions  No      Balance Screen   Has the patient fallen in the past 6 months  No    Has the patient had a decrease in activity level because of a fear of falling?   No    Is the patient reluctant to leave their home because of a fear of falling?   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      Cognition   Overall Cognitive Status  Within Functional Limits for tasks assessed      Observation/Other Assessments   Focus on Therapeutic Outcomes (FOTO)   Perform next session      ROM / Strength   AROM / PROM / Strength  AROM;Strength      AROM   AROM Assessment Site  Lumbar    Lumbar Flexion  25% limited    Lumbar Extension  25% limited    Lumbar - Right Side Bend  WFL    Lumbar - Left Side Bend  WFL    Lumbar - Right  Rotation  WFL     Lumbar - Left Rotation  [facility]      Strength   Strength Assessment Site  Hip;Knee;Ankle    Right/Left Hip  Right;Left    Right Hip Flexion  4+/5    Right Hip Extension  4-/5    Right Hip ABduction  4+/5    Left Hip Flexion  4+/5    Left Hip Extension  4-/5    Left Hip ABduction  4+/5    Right/Left Knee  Right;Left    Right Knee Flexion  5/5    Right Knee Extension  5/5    Left Knee Flexion  5/5    Left Knee Extension  5/5    Right/Left Ankle  Right;Left    Right Ankle Dorsiflexion  5/5    Left Ankle Dorsiflexion  5/5      Palpation   Spinal mobility  CPAs thoracic and lumbar spine WFL    Palpation comment  Tenderness around lumbar spine and through LT gluteals                  Objective measurements completed on examination: See above findings.      [facility] [facility] PT Treatment/Exercise - 02/27/20 0001      Exercises   Exercises  Lumbar      Lumbar Exercises: Stretches   Prone on Elbows Stretch  1 rep;60 seconds             PT Education - 02/27/20 1637    Education Details  Discussed examination findings, POC, and initial HEP.    Person(s) Educated  Patient    Methods  Explanation;Handout    Comprehension  Verbalized understanding       PT Short Term Goals - 02/27/20 1624      PT SHORT TERM GOAL #1   Title  Patient will report understanding and regular compliance with HEP to decrease pain and improve QoL.    Time  2    Period  Weeks    Status  New    Target Date  03/12/20        PT Long Term Goals - 02/27/20 1625      PT LONG TERM GOAL #1   Title  Patient will report overall improvement of at least 50% in subjective complaint for improved QoL.    Time  4    Period  Weeks    Status  New    Target Date  03/26/20      PT LONG TERM GOAL #2   Title  Patient will report that his back pain has not exceeded a 2/10 over the course of a 1 week period indicating improved tolerance to daily activities.    Time  4    Period   Weeks    Status  New    Target Date  03/26/20      PT LONG TERM GOAL #3   Title  Patient will deny any tenderness to palpation through lower back and gluteals indicating decreased sensitization and for improved overall daily mobility.    Time  4    Period  Weeks    Status  New    Target Date  03/26/20             Plan - 02/27/20 1633    Clinical Impression Statement  Patient is a 54 year old male who presented to outpatient physical therapy with primary complaint of low back pain. Upon examination patient demonstrates decreased LE strength, decreased AROM lumbar spine and increased tenderness to palpation of lumbar spine and gluteals. Based on patient's subjective complaint and clinical presentation, suspect that patient's low back pain has an extension bias. Educated patient on back extension and provided POE exercise to perform at home. Patient would benefit from continued skilled physical therapy in order to address the abovementioned deficits and help patient return to his PLOF.    Personal Factors and Comorbidities  Age    Examination-Activity Limitations  Bend;Carry;Sit    Examination-Participation Restrictions  Yard Work;Community Activity;Driving    Stability/Clinical Decision Making  Stable/Uncomplicated    Clinical Decision Making  Low    Rehab Potential  Good    PT Frequency  2x / week    PT Duration  4 weeks    PT Treatment/Interventions  ADLs/Self Care Home Management;Aquatic Therapy;Cryotherapy;Electrical Stimulation;Moist Heat;Traction;DME Instruction;Gait training;Stair training;Functional mobility training;Therapeutic activities;Therapeutic exercise;Balance training;Neuromuscular re-education;Patient/family education;Orthotic Fit/Training;Manual techniques;Passive range of motion;Dry needling;Taping    PT Next Visit Plan  Review Eval/goals and initial HEP. Complete FOTO. Focus on extension based exercises. Initiate STM to lumbar paraspinals and LT gluteals PRN.  Functional LE strengthening as able.    PT Home Exercise Plan  02/27/20: POE    Consulted and Agree with Plan of Care  Patient       Patient will benefit from skilled therapeutic intervention in order to improve the following deficits and impairments:  Pain, Decreased activity tolerance, Decreased endurance, Decreased range of motion, Decreased strength  Visit Diagnosis: Low back pain, unspecified back pain laterality, unspecified chronicity, unspecified whether sciatica present - Plan: PT plan of care cert/re-cert  Muscle weakness (generalized) - Plan: PT plan of care cert/re-cert     Problem List Patient Active Problem List   Diagnosis Date Noted  . Allergic rhinitis 04/02/2017  . Prostate cancer screening 03/27/2016  . Family history of premature coronary artery disease 11/10/2013  . Calculus of ureter 08/31/2013  . Hyperlipidemia 11/27/2012  . Gilbert's syndrome    Clarene Critchley  PT, DPT 4:45 PM, 02/27/20 [place], [place], [address] Phone: [phone]   Fax:  [phone]  Name: HAROLDO ARNWINE MRN: [id number] Date of Birth: Jun 02, 1966

## 2020-02-27 NOTE — Patient Instructions (Signed)
On Elbows (Prone)    Rise up on elbows as high as possible, keeping hips on floor. Hold _60___ seconds. Repeat _1-2___ times per set. Do __1-2__ sessions per day.  http://orth.exer.us/92   Copyright  VHI. All rights reserved.

## 2020-03-05 ENCOUNTER — Emergency Department (HOSPITAL_COMMUNITY): Payer: 59

## 2020-03-05 ENCOUNTER — Other Ambulatory Visit: Payer: Self-pay

## 2020-03-05 ENCOUNTER — Encounter (HOSPITAL_COMMUNITY): Payer: Self-pay | Admitting: *Deleted

## 2020-03-05 ENCOUNTER — Emergency Department (HOSPITAL_COMMUNITY)
Admission: EM | Admit: 2020-03-05 | Discharge: 2020-03-05 | Disposition: A | Discharge status: Home / Self Care | Payer: 59 | Attending: Emergency Medicine | Admitting: Emergency Medicine

## 2020-03-05 DIAGNOSIS — R935 Abnormal findings on diagnostic imaging of other abdominal regions, including retroperitoneum: Secondary | ICD-10-CM | POA: Insufficient documentation

## 2020-03-05 DIAGNOSIS — Z79899 Other long term (current) drug therapy: Secondary | ICD-10-CM | POA: Insufficient documentation

## 2020-03-05 DIAGNOSIS — R7401 Elevation of levels of liver transaminase levels: Secondary | ICD-10-CM

## 2020-03-05 DIAGNOSIS — R140 Abdominal distension (gaseous): Secondary | ICD-10-CM | POA: Diagnosis present

## 2020-03-05 LAB — COMPREHENSIVE METABOLIC PANEL
ALT: 181 U/L — ABNORMAL HIGH (ref 0–44)
AST: 216 U/L — ABNORMAL HIGH (ref 15–41)
Albumin: 5 g/dL (ref 3.5–5.0)
Alkaline Phosphatase: 78 U/L (ref 38–126)
Anion gap: 12 (ref 5–15)
BUN: 11 mg/dL (ref 6–20)
CO2: 24 mmol/L (ref 22–32)
Calcium: 10.8 mg/dL — ABNORMAL HIGH (ref 8.9–10.3)
Chloride: 104 mmol/L (ref 98–111)
Creatinine, Ser: 0.85 mg/dL (ref 0.61–1.24)
GFR calc Af Amer: >60 mL/min (ref >60)
GFR calc non Af Amer: >60 mL/min (ref >60)
Glucose, Bld: 111 mg/dL — ABNORMAL HIGH (ref 70–99)
Potassium: 4 mmol/L (ref 3.5–5.1)
Sodium: 140 mmol/L (ref 135–145)
Total Bilirubin: 1.8 mg/dL — ABNORMAL HIGH (ref 0.3–1.2)
Total Protein: 8.5 g/dL — ABNORMAL HIGH (ref 6.5–8.1)

## 2020-03-05 LAB — CBC
HCT: 49.4 % (ref 39.0–52.0)
Hemoglobin: 15.3 g/dL (ref 13.0–17.0)
MCH: 27.5 pg (ref 26.0–34.0)
MCHC: 31 g/dL (ref 30.0–36.0)
MCV: 88.8 fL (ref 80.0–100.0)
Platelets: 192 10*3/uL (ref 150–400)
RBC: 5.56 MIL/uL (ref 4.22–5.81)
RDW: 13.5 % (ref 11.5–15.5)
WBC: 6.6 10*3/uL (ref 4.0–10.5)
nRBC: 0 % (ref 0.0–0.2)

## 2020-03-05 LAB — LIPASE, BLOOD: Lipase: 32 U/L (ref 11–51)

## 2020-03-05 MED ORDER — PANTOPRAZOLE SODIUM 40 MG PO TBEC
40.0000 mg | DELAYED_RELEASE_TABLET | Freq: Once | ORAL | Status: AC
  Administered 2020-03-05: 40 mg via ORAL
  Filled 2020-03-05: qty 1

## 2020-03-05 MED ORDER — IOHEXOL 300 MG/ML  SOLN
100.0000 mL | Freq: Once | INTRAMUSCULAR | Status: AC | PRN
  Administered 2020-03-05: 100 mL via INTRAVENOUS

## 2020-03-05 NOTE — ED Triage Notes (Signed)
Pt states he is having abdominal pain with bloating and when he belches he states it is a "sour" taste

## 2020-03-05 NOTE — ED Provider Notes (Signed)
[facility] EMERGENCY DEPARTMENT Provider Note   CSN: [id number] Arrival date & time: 03/05/20  1131     History Chief Complaint  Patient presents with  . Abdominal Pain    Devin Gross is a 54 y.o. male.  HPI   54 y/o male - with hx of prior Gilbert's syndrome - reports that after eating 3 hamburgers (thick angus meat) and home made ice cream at a cook out yesterday went home and felt bloated - has been having "sour burps" - and has been passing gas.  Has no sig pain but feels bloated.  No diarrhea, no rectal  Bleeding and not nauseated.  Sx are persistent and nothing makes better or worse.  Past Medical History:  Diagnosis Date  . Calculus of ureter 08/31/2013  . Gilbert's syndrome   . Hypercholesteremia     Patient Active Problem List   Diagnosis Date Noted  . Allergic rhinitis 04/02/2017  . Prostate cancer screening 03/27/2016  . Family history of premature coronary artery disease 11/10/2013  . Calculus of ureter 08/31/2013  . Hyperlipidemia 11/27/2012  . Gilbert's syndrome     Past Surgical History:  Procedure Laterality Date  . COLONOSCOPY N/A 12/28/2017   Procedure: COLONOSCOPY;  Surgeon: Danie Binder, MD;  Location: [facility];  Service: Endoscopy;  Laterality: N/A;  12:00pm  . EYE SURGERY         Family History  Problem Relation Age of Onset  . Heart disease Father 16       CABG no prior hx  . Cancer Father        prostate cancer   . Hypertension Brother   . Colon polyps Brother        age 61  . Hypertension Maternal Grandmother   . Colon cancer Neg Hx     Social History   Tobacco Use  . Smoking status: Never Smoker  . Smokeless tobacco: Never Used  Substance Use Topics  . Alcohol use: No  . Drug use: No    Home Medications Prior to Admission medications   Medication Sig Start Date End Date Taking? Authorizing Provider  atorvastatin (LIPITOR) 20 MG tablet TAKE 1 TABLET BY MOUTH AT BEDTIME. 03/16/19   [place], Modena Nunnery, MD    cetirizine (ZYRTEC) 10 MG tablet Take 1 tablet (10 mg total) by mouth daily. Patient taking differently: Take 10 mg by mouth daily as needed (for seasonal allergies.).  04/02/17   Orlena Sheldon, PA-C    Allergies    Patient has no known allergies.  Review of Systems   Review of Systems  All other systems reviewed and are negative.   Physical Exam Updated Vital Signs BP (!) 155/101 (BP Location: Right Arm)   Pulse 71   Temp 97.7 F (36.5 C)   Resp 14   Ht 1.854 m (6\' 1" )   Wt 77.1 kg   SpO2 99%   BMI 22.43 kg/m   Physical Exam Vitals and nursing note reviewed.  Constitutional:      General: He is not in acute distress.    Appearance: He is well-developed.  HENT:     Head: Normocephalic and atraumatic.     Mouth/Throat:     Pharynx: No oropharyngeal exudate.  Eyes:     General: No scleral icterus.       Right eye: No discharge.        Left eye: No discharge.     Conjunctiva/sclera: Conjunctivae normal.  Pupils: Pupils are equal, round, and reactive to light.  Neck:     Thyroid: No thyromegaly.     Vascular: No JVD.  Cardiovascular:     Rate and Rhythm: Normal rate and regular rhythm.     Heart sounds: Normal heart sounds. No murmur. No friction rub. No gallop.   Pulmonary:     Effort: Pulmonary effort is normal. No respiratory distress.     Breath sounds: Normal breath sounds. No wheezing or rales.  Abdominal:     General: Bowel sounds are normal. There is distension.     Palpations: Abdomen is soft. There is no mass.     Tenderness: There is no abdominal tenderness.     Comments: Normal BS, no ttp, no guarding, specifically, no murphy's sign, no ttp at [place] and no LlQ ttp.  No epigastric ttp, no tympanitic sounds to percussion  Musculoskeletal:        General: No tenderness. Normal range of motion.     Cervical back: Normal range of motion and neck supple.  Lymphadenopathy:     Cervical: No cervical adenopathy.  Skin:    General: Skin is warm and  dry.     Findings: No erythema or rash.  Neurological:     Mental Status: He is alert.     Coordination: Coordination normal.  Psychiatric:        Behavior: Behavior normal.     ED Results / Procedures / Treatments   Labs (all labs ordered are listed, but only abnormal results are displayed) Labs Reviewed  COMPREHENSIVE METABOLIC PANEL - Abnormal; Notable for the following components:      Result Value   Glucose, Bld 111 (*)    Calcium 10.8 (*)    Total Protein 8.5 (*)    AST 216 (*)    ALT 181 (*)    Total Bilirubin 1.8 (*)    All other components within normal limits  LIPASE, BLOOD  CBC    EKG None  Radiology CT ABDOMEN PELVIS W CONTRAST  Result Date: 03/05/2020 CLINICAL DATA:  Abdominal pain and bloating EXAM: CT ABDOMEN AND PELVIS WITH CONTRAST TECHNIQUE: Multidetector CT imaging of the abdomen and pelvis was performed using the standard protocol following bolus administration of intravenous contrast. CONTRAST:  148mL OMNIPAQUE IOHEXOL 300 MG/ML  SOLN COMPARISON:  July 20, 2013 FINDINGS: Lower chest: Lung bases are clear. Hepatobiliary: No focal liver lesions are apparent. Gallbladder wall is not appreciably thickened. There is no biliary duct dilatation. Pancreas: There is no pancreatic mass or inflammatory focus. Spleen: No splenic lesions are evident. Adrenals/Urinary Tract: Adrenals bilaterally appear normal. There is a 6 mm cyst in the upper pole of the left kidney laterally. There is no hydronephrosis on either side. There is no appreciable renal or ureteral calculus on either side. Urinary bladder is midline with wall thickness within normal limits. Stomach/Bowel: Most small bowel loops are fluid-filled. There is no appreciable bowel wall thickening. There is no demonstrable bowel obstruction. The terminal ileum appears normal. There is slight fatty infiltration in the ileocecal valve. No free air or portal venous air. Vascular/Lymphatic: No abdominal aortic aneurysm.  No arterial vascular lesions are appreciable. Major venous structures appear patent. There is no evident adenopathy in the abdomen or pelvis by size criteria. There are multiple rights abdominal mesenteric lymph nodes, subcentimeter in size. Reproductive: Prostate is mildly enlarged. Prostate abuts the inferior aspect of the bladder in this difficult to separate from the inferior urinary bladder. Seminal vesicles  appear unremarkable. Other: Appendix appears normal. No evident abscess or ascites in the abdomen or pelvis. There is slight fat in the umbilicus. There is mild fat in each inguinal ring. Musculoskeletal: No blastic or lytic bone lesions. No intramuscular or abdominal wall lesions are evident. IMPRESSION: 1. Most small bowel loops are fluid-filled. This finding potentially may be normal but also could be indicative of a degree of enteritis. No bowel wall thickening. No bowel obstruction. No free air. 2. No adenopathy by size criteria. Subcentimeter lymph nodes in the right abdomen are noted. These lymph nodes may well be nonspecific. In the appropriate clinical setting, they potentially could be indicative of a degree of mesenteric adenitis. 3.  No abscess in the abdomen or pelvis.  Appendix appears normal. 4. No renal or ureteral calculus. No hydronephrosis. Urinary bladder wall thickness is within normal limits. 5. Prominent prostate with loss of clear definition of fat plane between the prostate and inferior bladder. This finding warrants clinical assessment and PSA evaluation. Electronically Signed   By: Lowella Grip III M.D.   On: 03/05/2020 13:37    Procedures Procedures (including critical care time)  Medications Ordered in ED Medications  pantoprazole (PROTONIX) EC tablet 40 mg (40 mg Oral Given 03/05/20 1233)  iohexol (OMNIPAQUE) 300 MG/ML solution 100 mL (100 mLs Intravenous Contrast Given 03/05/20 1320)    ED Course  I have reviewed the triage vital signs and the nursing  notes.  Pertinent labs & imaging results that were available during my care of the patient were reviewed by me and considered in my medical decision making (see chart for details).    MDM Rules/Calculators/A&P                      Check labs Non surgical abd Likely food overload / increased fatty / protein meal No signs of food poisoning, no fever / vomiting Pt agreeable to the plan.  Labs were abnormal showing slight elevation in liver function test, the protein was elevated, bilirubin was slightly elevated, this patient does have a history of Gilbert's disease, thus elevated liver function is not terribly abnormal.  CT scan was ordered and does not fact show that the patient has a slightly abnormal prostate, some subcentimeter lymph nodes, he has been encouraged to follow-up in the outpatient setting to follow-up on these results.  Otherwise well-appearing  Final Clinical Impression(s) / ED Diagnoses Final diagnoses:  Transaminitis  Abdominal distention  Abnormal abdominal CT scan    Rx / DC Orders ED Discharge Orders    None       Noemi Chapel, MD 03/05/20 1347

## 2020-03-05 NOTE — Discharge Instructions (Signed)
Your testing today showed that your liver function was up just a little bit, your CT scan showed that your prostate was enlarged and that you had a mild case of enteritis which is inflammation of your bowels.  This should get better over the next couple of days however be aware that you may have some diarrhea.  You must do the following:   Drink plenty of clear liquids over the next 24 hours Avoid any alcohol or heavy foods especially those that are fried fatty or high in protein Follow-up with your doctor this week because of the abnormal CT scan which showed that you had a slightly abnormal prostate, this may require further testing to make sure this is not early prostate cancer, have your doctor recheck your liver function within the week to make sure it is getting better  You may return to the emergency department for severe or worsening symptoms, this includes increasing abdominal pain, distention, vomiting, fever, jaundice, diarrhea or any other concerning symptoms

## 2020-03-08 ENCOUNTER — Ambulatory Visit (HOSPITAL_COMMUNITY): Payer: 59 | Attending: Family Medicine | Admitting: Physical Therapy

## 2020-03-08 ENCOUNTER — Other Ambulatory Visit: Payer: Self-pay

## 2020-03-08 DIAGNOSIS — M545 Low back pain, unspecified: Secondary | ICD-10-CM

## 2020-03-08 DIAGNOSIS — M6281 Muscle weakness (generalized): Secondary | ICD-10-CM

## 2020-03-08 NOTE — Therapy (Signed)
[place], [place], [address] Phone: [phone]   Fax:  [phone]  Physical Therapy Treatment  Patient Details  Name: Devin Gross MRN: [id number] Date of Birth: [birth year] Referring Provider (PT): Vic Blackbird, MD   Encounter Date: 03/08/2020  PT End of Session - 03/08/20 0930    Visit Number  2    Number of Visits  8    Date for PT Re-Evaluation  03/26/20    Authorization Type  UHC    Authorization - Visit Number  2    Authorization - Number of Visits  60    Progress Note Due on Visit  8    PT Start Time  [phone]    PT Stop Time  0915    PT Time Calculation (min)  40 min    Activity Tolerance  Patient tolerated treatment well    Behavior During Therapy  [facility] for tasks assessed/performed       Past Medical History:  Diagnosis Date  . Calculus of ureter 08/31/2013  . Gilbert's syndrome   . Hypercholesteremia     Past Surgical History:  Procedure Laterality Date  . COLONOSCOPY N/A 12/28/2017   Procedure: COLONOSCOPY;  Surgeon: Danie Binder, MD;  [facility];  Service: Endoscopy;  Laterality: N/A;  12:00pm  . EYE SURGERY      There were no vitals filed for this visit.  Subjective Assessment - 03/08/20 0839    Subjective  pt states he went to the ED on Monday due to eating too much at a cookout.  States his LB feels a little worse this week but it comes and goes.  Currently 7/10    Currently in Pain?  Yes    Pain Location  Back    Pain Orientation  Lower;Medial    Pain Descriptors / Indicators  Aching         [facility] PT Assessment - 03/08/20 0001      Assessment   Medical Diagnosis  Chronic bilateral Low back pain without sciatica    Referring Provider (PT)  Vic Blackbird, MD      Observation/Other Assessments   Focus on Therapeutic Outcomes (FOTO)   FS 83.5%                    [facility] Adult PT Treatment/Exercise - 03/08/20 0001      Lumbar Exercises:  Stretches   Single Knee to Chest Stretch  2 reps;20 seconds;Right;Left    Prone on Elbows Stretch  2 reps;60 seconds    Press Ups  10 reps      Lumbar Exercises: Standing   Other Standing Lumbar Exercises  hip excursions 10 reps each       Lumbar Exercises: Prone   Straight Leg Raise  10 reps;Limitations    Straight Leg Raises Limitations  bil with cues to not rotate trunk             PT Education - 03/08/20 0925    Education Details  reviewed goals, HEP and POC moving forward.  Completed FOTO and explained results.  Added hip excursions to HEP    Person(s) Educated  Patient    Methods  Explanation;Demonstration;Tactile cues;Verbal cues;Handout    Comprehension  Verbalized understanding;Returned demonstration;Verbal cues required;Tactile cues required       PT Short Term Goals - 03/08/20 0905      PT SHORT TERM GOAL #1   Title  Patient will report  understanding and regular compliance with HEP to decrease pain and improve QoL.    Time  2    Period  Weeks    Status  On-going    Target Date  03/12/20        PT Long Term Goals - 03/08/20 0905      PT LONG TERM GOAL #1   Title  Patient will report overall improvement of at least 50% in subjective complaint for improved QoL.    Time  4    Period  Weeks    Status  On-going      PT LONG TERM GOAL #2   Title  Patient will report that his back pain has not exceeded a 2/10 over the course of a 1 week period indicating improved tolerance to daily activities.    Time  4    Period  Weeks    Status  On-going      PT LONG TERM GOAL #3   Title  Patient will deny any tenderness to palpation through lower back and gluteals indicating decreased sensitization and for improved overall daily mobility.    Time  4    Period  Weeks    Status  On-going            Plan - 03/08/20 [id number]    Clinical Impression Statement  Reviewed goals, HEP and POC moving forward.  Completed FOTO with only 16% functional impairment.  Added hip  extensor strengthening, standing lumbar mobility and press ups to POC with good form and no pain.  Added hip excursions to HEP.  Pt reported overall feeling better at conclusion of session.    Personal Factors and Comorbidities  Age    Examination-Activity Limitations  Bend;Carry;Sit    Examination-Participation Restrictions  Yard Work;Community Activity;Driving    Stability/Clinical Decision Making  Stable/Uncomplicated    Rehab Potential  Good    PT Frequency  2x / week    PT Duration  4 weeks    PT Treatment/Interventions  ADLs/Self Care Home Management;Aquatic Therapy;Cryotherapy;Electrical Stimulation;Moist Heat;Traction;DME Instruction;Gait training;Stair training;Functional mobility training;Therapeutic activities;Therapeutic exercise;Balance training;Neuromuscular re-education;Patient/family education;Orthotic Fit/Training;Manual techniques;Passive range of motion;Dry needling;Taping    PT Next Visit Plan  Focus on extension based exercises. Initiate STM to lumbar paraspinals and LT gluteals PRN. Functional LE strengthening as able.  Add hamstirng and pirifromis stretch if indicated by tightness.    PT Home Exercise Plan  02/27/20: POE   6/3:  standing hip excursions (3 way)    Consulted and Agree with Plan of Care  Patient       Patient will benefit from skilled therapeutic intervention in order to improve the following deficits and impairments:  Pain, Decreased activity tolerance, Decreased endurance, Decreased range of motion, Decreased strength  Visit Diagnosis: Low back pain, unspecified back pain laterality, unspecified chronicity, unspecified whether sciatica present  Muscle weakness (generalized)     Problem List Patient Active Problem List   Diagnosis Date Noted  . Allergic rhinitis 04/02/2017  . Prostate cancer screening 03/27/2016  . Family history of premature coronary artery disease 11/10/2013  . Calculus of ureter 08/31/2013  . Hyperlipidemia 11/27/2012  .  Gilbert's syndrome    Teena Irani, PTA/CLT [phone]  Teena Irani 03/08/2020, 9:33 AM  [place] [address] [place], [place], [address] Phone: [phone]   Fax:  [phone]  Name: Devin Gross MRN: [id number] Date of Birth: 07-01-1966

## 2020-03-09 ENCOUNTER — Ambulatory Visit (HOSPITAL_COMMUNITY): Payer: 59

## 2020-03-09 ENCOUNTER — Encounter (HOSPITAL_COMMUNITY): Payer: Self-pay

## 2020-03-09 DIAGNOSIS — M6281 Muscle weakness (generalized): Secondary | ICD-10-CM

## 2020-03-09 DIAGNOSIS — M545 Low back pain, unspecified: Secondary | ICD-10-CM

## 2020-03-09 NOTE — Therapy (Signed)
[place], [place], [address] Phone: [phone]   Fax:  [phone]  Physical Therapy Treatment  Patient Details  Name: Devin Gross MRN: [id number] Date of Birth: 20-Oct-1965 Referring Provider (PT): Vic Blackbird, MD   Encounter Date: 03/09/2020  PT End of Session - 03/09/20 0834    Visit Number  3    Number of Visits  8    Date for PT Re-Evaluation  03/26/20    Authorization Type  UHC    Authorization - Visit Number  3    Authorization - Number of Visits  60    Progress Note Due on Visit  8    PT Start Time  0827    PT Stop Time  0905    PT Time Calculation (min)  38 min    Activity Tolerance  Patient tolerated treatment well    Behavior During Therapy  [facility] for tasks assessed/performed       Past Medical History:  Diagnosis Date  . Calculus of ureter 08/31/2013  . Gilbert's syndrome   . Hypercholesteremia     Past Surgical History:  Procedure Laterality Date  . COLONOSCOPY N/A 12/28/2017   Procedure: COLONOSCOPY;  Surgeon: Danie Binder, MD;  [facility];  Service: Endoscopy;  Laterality: N/A;  12:00pm  . EYE SURGERY      There were no vitals filed for this visit.  Subjective Assessment - 03/09/20 0832    Subjective  Pt stated he had a good night sleep and has been complaint wiht HEP daily.  Stated he has a catch in Rt hip today, pain scale 5/10    Pertinent History  LBP of insidious onset    Patient Stated Goals  To have less pain    Currently in Pain?  Yes    Pain Score  5     Pain Location  Back    Pain Orientation  Lower;Right    Pain Descriptors / Indicators  Aching    Pain Type  Acute pain    Pain Onset  1 to 4 weeks ago    Pain Frequency  Intermittent    Aggravating Factors   Sitting    Pain Relieving Factors  stretching                        [facility] Adult PT Treatment/Exercise - 03/09/20 0001      Exercises   Exercises  Lumbar      Lumbar  Exercises: Stretches   Active Hamstring Stretch  2 reps;30 seconds    Active Hamstring Stretch Limitations  supine hands behind knees    Lower Trunk Rotation  5 reps;10 seconds    Prone on Elbows Stretch Limitations  2 minutes    Press Ups  10 reps;10 seconds    Figure 4 Stretch  2 reps;30 seconds;Supine      Lumbar Exercises: Standing   Functional Squats  10 reps    Functional Squats Limitations  cueing for mechanics, complete infront of chair    Other Standing Lumbar Exercises  hip excursions 10 reps each       Lumbar Exercises: Supine   Bridge  10 reps    Bridge Limitations  2 sets      Lumbar Exercises: Prone   Straight Leg Raise  10 reps;Limitations    Straight Leg Raises Limitations  bil with cues to not rotate trunk  PT Short Term Goals - 03/08/20 0905      PT SHORT TERM GOAL #1   Title  Patient will report understanding and regular compliance with HEP to decrease pain and improve QoL.    Time  2    Period  Weeks    Status  On-going    Target Date  03/12/20        PT Long Term Goals - 03/08/20 0905      PT LONG TERM GOAL #1   Title  Patient will report overall improvement of at least 50% in subjective complaint for improved QoL.    Time  4    Period  Weeks    Status  On-going      PT LONG TERM GOAL #2   Title  Patient will report that his back pain has not exceeded a 2/10 over the course of a 1 week period indicating improved tolerance to daily activities.    Time  4    Period  Weeks    Status  On-going      PT LONG TERM GOAL #3   Title  Patient will deny any tenderness to palpation through lower back and gluteals indicating decreased sensitization and for improved overall daily mobility.    Time  4    Period  Weeks    Status  On-going            Plan - 03/09/20 7893    Clinical Impression Statement  Session focus with lumbar mobility and extension based exercises.  Added bridges and LE stretches to POC for gluteal  strengthening and mobility.  No reports of increased pain.  Did require verbal cueing for proper form and mechanics with new exercises.    Personal Factors and Comorbidities  Age    Examination-Activity Limitations  Bend;Carry;Sit    Examination-Participation Restrictions  Yard Work;Community Activity;Driving    Stability/Clinical Decision Making  Stable/Uncomplicated    Clinical Decision Making  Low    Rehab Potential  Good    PT Frequency  2x / week    PT Duration  4 weeks    PT Treatment/Interventions  ADLs/Self Care Home Management;Aquatic Therapy;Cryotherapy;Electrical Stimulation;Moist Heat;Traction;DME Instruction;Gait training;Stair training;Functional mobility training;Therapeutic activities;Therapeutic exercise;Balance training;Neuromuscular re-education;Patient/family education;Orthotic Fit/Training;Manual techniques;Passive range of motion;Dry needling;Taping    PT Next Visit Plan  Focus on extension based exercises. Initiate STM to lumbar paraspinals and LT gluteals PRN. Functional LE strengthening as able.    PT Home Exercise Plan  02/27/20: POE   6/3:  standing hip excursions (3 way); bridge and prone SLR       Patient will benefit from skilled therapeutic intervention in order to improve the following deficits and impairments:  Pain, Decreased activity tolerance, Decreased endurance, Decreased range of motion, Decreased strength  Visit Diagnosis: Muscle weakness (generalized)  Low back pain, unspecified back pain laterality, unspecified chronicity, unspecified whether sciatica present     Problem List Patient Active Problem List   Diagnosis Date Noted  . Allergic rhinitis 04/02/2017  . Prostate cancer screening 03/27/2016  . Family history of premature coronary artery disease 11/10/2013  . Calculus of ureter 08/31/2013  . Hyperlipidemia 11/27/2012  . Gilbert's syndrome    Ihor Austin, LPTA/CLT; CBIS [phone]  Aldona Lento 03/09/2020, 9:08 AM  [place], [place], [address] Phone: [phone]   Fax:  [phone]  Name: Devin Gross MRN: [id number] Date of Birth: [birth year]

## 2020-03-09 NOTE — Patient Instructions (Signed)
Bridging    Slowly raise buttocks from floor, keeping stomach tight. Repeat 10_ times per set. Do 2 sets per day  http://orth.exer.us/1096   Copyright  VHI. All rights reserved.   Straight Leg Raise (Prone)    Abdomen and head supported, keep left knee locked and raise leg at hip. Avoid arching low back. Repeat 10 times per set. Do 2 sets per day.   http://orth.exer.us/1112   Copyright  VHI. All rights reserved.

## 2020-03-13 ENCOUNTER — Encounter: Payer: Self-pay | Admitting: Family Medicine

## 2020-03-13 ENCOUNTER — Ambulatory Visit (INDEPENDENT_AMBULATORY_CARE_PROVIDER_SITE_OTHER): Payer: 59 | Admitting: Family Medicine

## 2020-03-13 ENCOUNTER — Other Ambulatory Visit: Payer: Self-pay

## 2020-03-13 VITALS — BP 132/66 | HR 74 | Temp 98.3°F | Resp 14 | Ht 74.0 in | Wt 170.0 lb

## 2020-03-13 DIAGNOSIS — K529 Noninfective gastroenteritis and colitis, unspecified: Secondary | ICD-10-CM

## 2020-03-13 DIAGNOSIS — M545 Low back pain, unspecified: Secondary | ICD-10-CM

## 2020-03-13 DIAGNOSIS — R7989 Other specified abnormal findings of blood chemistry: Secondary | ICD-10-CM | POA: Diagnosis not present

## 2020-03-13 DIAGNOSIS — G8929 Other chronic pain: Secondary | ICD-10-CM

## 2020-03-13 DIAGNOSIS — N400 Enlarged prostate without lower urinary tract symptoms: Secondary | ICD-10-CM | POA: Diagnosis not present

## 2020-03-13 LAB — URINALYSIS, ROUTINE W REFLEX MICROSCOPIC
Bacteria, UA: NONE SEEN /HPF
Bilirubin Urine: NEGATIVE
Glucose, UA: NEGATIVE
Ketones, ur: NEGATIVE
Leukocytes,Ua: NEGATIVE
Nitrite: NEGATIVE
Protein, ur: NEGATIVE
Specific Gravity, Urine: 1.026 (ref 1.001–1.03)
Squamous Epithelial / HPF: NONE SEEN /HPF (ref <=5)
WBC, UA: NONE SEEN /HPF (ref 0–5)
pH: 5.5 (ref 5.0–8.0)

## 2020-03-13 LAB — MICROSCOPIC MESSAGE

## 2020-03-13 MED ORDER — MELOXICAM 7.5 MG PO TABS
7.5000 mg | ORAL_TABLET | Freq: Every day | ORAL | 0 refills | Status: DC | PRN
Start: 2020-03-13 — End: 2021-01-10

## 2020-03-13 MED ORDER — CYCLOBENZAPRINE HCL 10 MG PO TABS
10.0000 mg | ORAL_TABLET | Freq: Three times a day (TID) | ORAL | 0 refills | Status: DC | PRN
Start: 2020-03-13 — End: 2021-01-10

## 2020-03-13 NOTE — Progress Notes (Signed)
   Subjective:    Patient ID: Devin Gross, male    DOB: 08-Sep-1966, 54 y.o.   MRN: [id number]  Patient presents for ER F/U (enteritis)  Pt here for ER follow up He was at a cookout on Sunday before memorial day, he a lot of food he had sour stomach with belching and foul odor to belch. He was quite gassy and had small BM. His abd distention did not improve after 24 hours so he went to ER. 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     Review Of Systems:  GEN- denies fatigue, fever, weight loss,weakness, recent illness HEENT- denies eye drainage, change in vision, nasal discharge, CVS- denies chest pain, palpitations RESP- denies SOB, cough, wheeze ABD- denies N/V, change in stools, abd pain GU- denies dysuria, hematuria, dribbling, incontinence MSK- + joint pain, muscle aches, injury Neuro- denies headache, dizziness, syncope, seizure activity       Objective:    BP 132/66   Pulse 74   Temp 98.3 F (36.8 C) (Temporal)   Resp 14   Ht 6\' 2"  (1.88 m)   Wt 170 lb (77.1 kg)   SpO2 98%   BMI 21.83 kg/m  GEN- NAD, alert and oriented x3 HEENT- PERRL, EOMI, non injected sclera, pink conjunctiva, MMM, oropharynx clear Neck- Supple, no thyromegaly CVS- RRR, no murmur RESP-CTAB ABD-NABS,soft,NT,ND EXT- No edema Pulses- Radial, DP- 2+        Assessment & Plan:      Problem List Items Addressed This Visit    None    Visit Diagnoses    Elevated liver function tests    -  Primary   recheck LFT, symptoms of enteritis resolved, it still elevated will D/C statin and get GI appt    Relevant Orders   Urinalysis, Routine w reflex microscopic (Completed)   CBC with Differential/Platelet   Comprehensive metabolic panel   Enteritis       Relevant Orders   Urinalysis, Routine w reflex microscopic (Completed)   CBC with Differential/Platelet   Comprehensive metabolic panel   Enlarged prostate       No symptoms recent PSA normal, no sign of UTI, recheck UA today   Relevant Orders   Urinalysis, Routine w reflex microscopic (Completed)   Chronic midline low back pain without sciatica       refilled flexeril and mobic use prn   Relevant Medications   cyclobenzaprine (FLEXERIL) 10 MG tablet   meloxicam (MOBIC) 7.5 MG tablet      Note: This dictation was prepared with Dragon dictation along with smaller phrase technology. Any transcriptional errors that result from this process are unintentional.

## 2020-03-13 NOTE — Patient Instructions (Signed)
F/U pending results   

## 2020-03-14 LAB — COMPREHENSIVE METABOLIC PANEL
AG Ratio: 1.9 (calc) (ref 1.0–2.5)
ALT: 29 U/L (ref 9–46)
AST: 17 U/L (ref 10–35)
Albumin: 4.7 g/dL (ref 3.6–5.1)
Alkaline phosphatase (APISO): 67 U/L (ref 35–144)
BUN: 16 mg/dL (ref 7–25)
CO2: 24 mmol/L (ref 20–32)
Calcium: 9.6 mg/dL (ref 8.6–10.3)
Chloride: 106 mmol/L (ref 98–110)
Creat: 0.93 mg/dL (ref 0.70–1.33)
Globulin: 2.5 g/dL (calc) (ref 1.9–3.7)
Glucose, Bld: 101 mg/dL — ABNORMAL HIGH (ref 65–99)
Potassium: 3.7 mmol/L (ref 3.5–5.3)
Sodium: 141 mmol/L (ref 135–146)
Total Bilirubin: 1.7 mg/dL — ABNORMAL HIGH (ref 0.2–1.2)
Total Protein: 7.2 g/dL (ref 6.1–8.1)

## 2020-03-14 LAB — CBC WITH DIFFERENTIAL/PLATELET
Absolute Monocytes: 532 cells/uL (ref 200–950)
Basophils Absolute: 39 cells/uL (ref 0–200)
Basophils Relative: 0.7 %
Eosinophils Absolute: 129 cells/uL (ref 15–500)
Eosinophils Relative: 2.3 %
HCT: 43.9 % (ref 38.5–50.0)
Hemoglobin: 14.4 g/dL (ref 13.2–17.1)
Lymphs Abs: 2083 cells/uL (ref 850–3900)
MCH: 27.7 pg (ref 27.0–33.0)
MCHC: 32.8 g/dL (ref 32.0–36.0)
MCV: 84.6 fL (ref 80.0–100.0)
MPV: 10.6 fL (ref 7.5–12.5)
Monocytes Relative: 9.5 %
Neutro Abs: 2817 cells/uL (ref 1500–7800)
Neutrophils Relative %: 50.3 %
Platelets: 249 10*3/uL (ref 140–400)
RBC: 5.19 10*6/uL (ref 4.20–5.80)
RDW: 12.7 % (ref 11.0–15.0)
Total Lymphocyte: 37.2 %
WBC: 5.6 10*3/uL (ref 3.8–10.8)

## 2020-03-15 ENCOUNTER — Ambulatory Visit (HOSPITAL_COMMUNITY): Payer: 59 | Admitting: Physical Therapy

## 2020-03-15 ENCOUNTER — Other Ambulatory Visit: Payer: Self-pay

## 2020-03-15 DIAGNOSIS — M545 Low back pain, unspecified: Secondary | ICD-10-CM

## 2020-03-15 DIAGNOSIS — M6281 Muscle weakness (generalized): Secondary | ICD-10-CM

## 2020-03-15 NOTE — Therapy (Signed)
[place], [place], [address] Phone: [phone]   Fax:  [phone]  Physical Therapy Treatment  Patient Details  Name: Devin Gross MRN: [id number] Date of Birth: [birth year] Referring Provider (PT): Vic Blackbird, MD   Encounter Date: 03/15/2020   PT End of Session - 03/15/20 1654    Visit Number 4    Number of Visits 8    Date for PT Re-Evaluation 03/26/20    Authorization Type UHC    Authorization - Visit Number 4    Authorization - Number of Visits 60    Progress Note Due on Visit 8    PT Start Time 1618    PT Stop Time 1658    PT Time Calculation (min) 40 min    Activity Tolerance Patient tolerated treatment well    Behavior During Therapy [facility] for tasks assessed/performed           Past Medical History:  Diagnosis Date  . Calculus of ureter 08/31/2013  . Gilbert's syndrome   . Hypercholesteremia     Past Surgical History:  Procedure Laterality Date  . COLONOSCOPY N/A 12/28/2017   Procedure: COLONOSCOPY;  Surgeon: Danie Binder, MD;  [facility];  Service: Endoscopy;  Laterality: N/A;  12:00pm  . EYE SURGERY      There were no vitals filed for this visit.   Subjective Assessment - 03/15/20 1634    Subjective PT states he can tell it's getting better.  Easing up and "fading on away".  Currently 2/10    Currently in Pain? Yes    Pain Score 2     Pain Location Back    Pain Orientation Right;Lower    Pain Descriptors / Indicators Aching                             [facility] Adult PT Treatment/Exercise - 03/15/20 0001      Lumbar Exercises: Stretches   Active Hamstring Stretch 2 reps;30 seconds    Active Hamstring Stretch Limitations supine hands behind knees    Single Knee to Chest Stretch 2 reps;20 seconds;Right;Left      Lumbar Exercises: Standing   Lifting Other (comment)    Lifting Limitations squats mechanics, i.e. item retrievel, working on  items lower than normal height.    Other Standing Lumbar Exercises hip excursions 10 reps each       Lumbar Exercises: Supine   Bridge 15 reps    Bridge Limitations 2 sets      Lumbar Exercises: Prone   Straight Leg Raise 15 reps;Limitations    Straight Leg Raises Limitations bil with cues to not rotate trunk    Other Prone Lumbar Exercises Prone on elbows 2 mintues                  PT Education - 03/15/20 1710    Education Details body mechanics, functional posturing    Person(s) Educated Patient    Methods Explanation;Demonstration;Tactile cues;Verbal cues    Comprehension Verbalized understanding;Returned demonstration;Verbal cues required;Tactile cues required;Need further instruction            PT Short Term Goals - 03/08/20 0905      PT SHORT TERM GOAL #1   Title Patient will report understanding and regular compliance with HEP to decrease pain and improve QoL.    Time 2    Period Weeks    Status On-going  Target Date 03/12/20             PT Long Term Goals - 03/08/20 0905      PT LONG TERM GOAL #1   Title Patient will report overall improvement of at least 50% in subjective complaint for improved QoL.    Time 4    Period Weeks    Status On-going      PT LONG TERM GOAL #2   Title Patient will report that his back pain has not exceeded a 2/10 over the course of a 1 week period indicating improved tolerance to daily activities.    Time 4    Period Weeks    Status On-going      PT LONG TERM GOAL #3   Title Patient will deny any tenderness to palpation through lower back and gluteals indicating decreased sensitization and for improved overall daily mobility.    Time 4    Period Weeks    Status On-going                 Plan - 03/15/20 1707    Clinical Impression Statement Contineud with focus on lumbar mobility and LE strengthening.  Educated this session on body mechanics with demonstration on proper lifting and working on items lower  than normal height (as does at work at times).  Pt continues to complete squats improperly and needs further cuing on this.  completed SLR with good control and no pain.  Overall patients pain is reducing.    Personal Factors and Comorbidities Age    Examination-Activity Limitations Bend;Carry;Sit    Examination-Participation Restrictions Yard Work;Community Activity;Driving    Stability/Clinical Decision Making Stable/Uncomplicated    Rehab Potential Good    PT Frequency 2x / week    PT Duration 4 weeks    PT Treatment/Interventions ADLs/Self Care Home Management;Aquatic Therapy;Cryotherapy;Electrical Stimulation;Moist Heat;Traction;DME Instruction;Gait training;Stair training;Functional mobility training;Therapeutic activities;Therapeutic exercise;Balance training;Neuromuscular re-education;Patient/family education;Orthotic Fit/Training;Manual techniques;Passive range of motion;Dry needling;Taping    PT Next Visit Plan Focus on extension based exercises. Functional LE strengthening as able.  Review body mechanics and work on proper squat form.    PT Home Exercise Plan 02/27/20: POE   6/3:  standing hip excursions (3 way); bridge and prone SLR           Patient will benefit from skilled therapeutic intervention in order to improve the following deficits and impairments:  Pain, Decreased activity tolerance, Decreased endurance, Decreased range of motion, Decreased strength  Visit Diagnosis: Low back pain, unspecified back pain laterality, unspecified chronicity, unspecified whether sciatica present  Muscle weakness (generalized)     Problem List Patient Active Problem List   Diagnosis Date Noted  . Allergic rhinitis 04/02/2017  . Prostate cancer screening 03/27/2016  . Family history of premature coronary artery disease 11/10/2013  . Calculus of ureter 08/31/2013  . Hyperlipidemia 11/27/2012  . Gilbert's syndrome    Teena Irani, PTA/CLT [phone]  Teena Irani 03/15/2020,  5:10 PM  [facility] [address] [place], [place], [address] Phone: [phone]   Fax:  [phone]  Name: Devin Gross MRN: [id number] Date of Birth: 1966/08/31

## 2020-03-16 ENCOUNTER — Encounter (HOSPITAL_COMMUNITY): Payer: Self-pay | Admitting: Physical Therapy

## 2020-03-16 ENCOUNTER — Ambulatory Visit (HOSPITAL_COMMUNITY): Payer: 59 | Admitting: Physical Therapy

## 2020-03-16 DIAGNOSIS — M545 Low back pain, unspecified: Secondary | ICD-10-CM

## 2020-03-16 DIAGNOSIS — M6281 Muscle weakness (generalized): Secondary | ICD-10-CM

## 2020-03-16 NOTE — Therapy (Signed)
Devin Gross, [place], [address] Phone: [phone]   Fax:  [phone]  Physical Therapy Treatment  Patient Details  Name: Devin Gross MRN: [id number] Date of Birth: 20-May-1966 Referring Provider (PT): Vic Blackbird, MD   Encounter Date: 03/16/2020   PT End of Session - 03/16/20 1521    Visit Number 5    Number of Visits 8    Date for PT Re-Evaluation 03/26/20    Authorization Type UHC    Authorization - Visit Number 5    Authorization - Number of Visits 60    Progress Note Due on Visit 8    PT Start Time 6767    PT Stop Time 1552    PT Time Calculation (min) 38 min    Activity Tolerance Patient tolerated treatment well    Behavior During Therapy [facility] for tasks assessed/performed           Past Medical History:  Diagnosis Date  . Calculus of ureter 08/31/2013  . Gilbert's syndrome   . Hypercholesteremia     Past Surgical History:  Procedure Laterality Date  . COLONOSCOPY N/A 12/28/2017   Procedure: COLONOSCOPY;  Surgeon: Danie Binder, MD;  [facility];  Service: Endoscopy;  Laterality: N/A;  12:00pm  . EYE SURGERY      There were no vitals filed for this visit.   Subjective Assessment - 03/16/20 1517    Subjective Patient reported that his pain has been improving. He stated he may want next week to be his last week.    Currently in Pain? Yes    Pain Score 3     Pain Location Back    Pain Orientation Right;Lower    Pain Descriptors / Indicators Aching    Pain Type Acute pain                             [facility] Adult PT Treatment/Exercise - 03/16/20 0001      Lumbar Exercises: Stretches   Active Hamstring Stretch 2 reps;30 seconds    Active Hamstring Stretch Limitations supine hands behind knees    Single Knee to Chest Stretch --    Other Lumbar Stretch Exercise Standing extension x15      Lumbar Exercises: Standing   Lifting Limitations Squat ground  to standing to place box on table 4# weight x 10. Good form    Other Standing Lumbar Exercises Hip extension x15 cues for glute activation and upright trunk    Other Standing Lumbar Exercises Standing extension x 15       Lumbar Exercises: Supine   Bridge 15 reps    Bridge Limitations 2 sets    Straight Leg Raise 15 reps      Lumbar Exercises: Prone   Straight Leg Raise 15 reps;Limitations    Straight Leg Raises Limitations bil with cues to not rotate trunk    Other Prone Lumbar Exercises Prone on elbows 2 mintues                    PT Short Term Goals - 03/08/20 0905      PT SHORT TERM GOAL #1   Title Patient will report understanding and regular compliance with HEP to decrease pain and improve QoL.    Time 2    Period Weeks    Status On-going    Target Date 03/12/20  PT Long Term Goals - 03/08/20 0905      PT LONG TERM GOAL #1   Title Patient will report overall improvement of at least 50% in subjective complaint for improved QoL.    Time 4    Period Weeks    Status On-going      PT LONG TERM GOAL #2   Title Patient will report that his back pain has not exceeded a 2/10 over the course of a 1 week period indicating improved tolerance to daily activities.    Time 4    Period Weeks    Status On-going      PT LONG TERM GOAL #3   Title Patient will deny any tenderness to palpation through lower back and gluteals indicating decreased sensitization and for improved overall daily mobility.    Time 4    Period Weeks    Status On-going                 Plan - 03/16/20 1559    Clinical Impression Statement Patient has reported improvement from beginning therapy and states he may feel ready to be discharged next week. Continued with extension-based exercises. This session added lifting mechanics practice with a 4# weighted box with multiple repetitions to check form over time. Patient demonstrated good form throughout. Patient did require cueing  to maintain upright trunk and keep knee straight with hip extension in standing. Possible discharge in the next couple sessions, pending patient's presentation.    Personal Factors and Comorbidities Age    Examination-Activity Limitations Bend;Carry;Sit    Examination-Participation Restrictions Yard Work;Community Activity;Driving    Stability/Clinical Decision Making Stable/Uncomplicated    Rehab Potential Good    PT Frequency 2x / week    PT Duration 4 weeks    PT Treatment/Interventions ADLs/Self Care Home Management;Aquatic Therapy;Cryotherapy;Electrical Stimulation;Moist Heat;Traction;DME Instruction;Gait training;Stair training;Functional mobility training;Therapeutic activities;Therapeutic exercise;Balance training;Neuromuscular re-education;Patient/family education;Orthotic Fit/Training;Manual techniques;Passive range of motion;Dry needling;Taping    PT Next Visit Plan Focus on extension based exercises. Functional LE strengthening as able.  Possible discharge in next couple sessions pending patient report and presentation    PT Home Exercise Plan 02/27/20: POE   6/3:  standing hip excursions (3 way); bridge and prone SLR           Patient will benefit from skilled therapeutic intervention in order to improve the following deficits and impairments:  Pain, Decreased activity tolerance, Decreased endurance, Decreased range of motion, Decreased strength  Visit Diagnosis: Low back pain, unspecified back pain laterality, unspecified chronicity, unspecified whether sciatica present  Muscle weakness (generalized)     Problem List Patient Active Problem List   Diagnosis Date Noted  . Allergic rhinitis 04/02/2017  . Prostate cancer screening 03/27/2016  . Family history of premature coronary artery disease 11/10/2013  . Calculus of ureter 08/31/2013  . Hyperlipidemia 11/27/2012  . Gilbert's syndrome    Devin Gross PT, DPT 4:01 PM, 03/16/20 [facility] [address] [place], [place], [address] Phone: [phone]   Fax:  [phone]  Name: Devin Gross MRN: [id number] Date of Birth: [birth year]

## 2020-03-22 ENCOUNTER — Other Ambulatory Visit: Payer: Self-pay

## 2020-03-22 ENCOUNTER — Ambulatory Visit (HOSPITAL_COMMUNITY): Payer: 59 | Admitting: Physical Therapy

## 2020-03-22 ENCOUNTER — Encounter (HOSPITAL_COMMUNITY): Payer: Self-pay | Admitting: Physical Therapy

## 2020-03-22 DIAGNOSIS — M6281 Muscle weakness (generalized): Secondary | ICD-10-CM

## 2020-03-22 DIAGNOSIS — M545 Low back pain, unspecified: Secondary | ICD-10-CM

## 2020-03-22 NOTE — Therapy (Signed)
[facility] [address] [place], [place], [address] Phone: [phone]   Fax:  [phone]  Physical Therapy Treatment and Discharge Note  Patient Details  Name: Devin Gross MRN: [id number] Date of Birth: 11-07-1965 Referring Provider (PT): Vic Blackbird, MD  PHYSICAL THERAPY DISCHARGE SUMMARY  Visits from Start of Care: 6  Current functional level related to goals / functional outcomes: All goals met  Remaining deficits: none   Education / Equipment: See below Plan: Patient agrees to discharge.  Patient goals were met. Patient is being discharged due to meeting the stated rehab goals.  ?????        Encounter Date: 03/22/2020   PT End of Session - 03/22/20 1607    Visit Number 6    Number of Visits 8    Date for PT Re-Evaluation 03/26/20    Authorization Type UHC    Authorization - Visit Number 6    Authorization - Number of Visits 60    Progress Note Due on Visit 8    PT Start Time 9532    PT Stop Time 1627    PT Time Calculation (min) 20 min    Activity Tolerance Patient tolerated treatment well    Behavior During Therapy WFL for tasks assessed/performed           Past Medical History:  Diagnosis Date  . Calculus of ureter 08/31/2013  . Gilbert's syndrome   . Hypercholesteremia     Past Surgical History:  Procedure Laterality Date  . COLONOSCOPY N/A 12/28/2017   Procedure: COLONOSCOPY;  Surgeon: Danie Binder, MD;  [facility];  Service: Endoscopy;  Laterality: N/A;  12:00pm  . EYE SURGERY      There were no vitals filed for this visit.   Subjective Assessment - 03/22/20 1626    Subjective States he knows his exercises and is doing them every other day. Patient reports he feels 90% better and he has no current pain.    Currently in Pain? No/denies              [facility] PT Assessment - 03/22/20 0001      Assessment   Medical Diagnosis Chronic bilateral Low back pain without  sciatica    Referring Provider (PT) Vic Blackbird, MD      Observation/Other Assessments   Focus on Therapeutic Outcomes (FOTO)  94% function      AROM   AROM Assessment Site Lumbar    Lumbar Flexion 0% limited    Lumbar Extension 25% limited    Lumbar - Right Side Bend [facility]    Lumbar - Left Side Bend [facility]    Lumbar - Right Rotation [facility]     Lumbar - Left Rotation [facility]      Strength   Strength Assessment Site Knee;Ankle;Hip    Right/Left Hip Right;Left    Right Hip Flexion 5/5    Right Hip Extension 4+/5    Right Hip ABduction 4+/5    Left Hip Flexion 5/5    Left Hip Extension 4+/5    Left Hip ABduction 4+/5    Right Knee Flexion 5/5    Right Knee Extension 5/5    Left Knee Flexion 5/5    Left Knee Extension 5/5    Right Ankle Dorsiflexion 5/5    Left Ankle Dorsiflexion 5/5      Palpation   Palpation comment no tenerdenss noted in glutes of lumbar musculature  PT Short Term Goals - 03/22/20 1617      PT SHORT TERM GOAL #1   Title Patient will report understanding and regular compliance with HEP to decrease pain and improve QoL.    Time 2    Period Weeks    Status Achieved    Target Date 03/12/20             PT Long Term Goals - 03/22/20 1618      PT LONG TERM GOAL #1   Title Patient will report overall improvement of at least 50% in subjective complaint for improved QoL.    Baseline 90% better    Time 4    Period Weeks    Status Achieved      PT LONG TERM GOAL #2   Title Patient will report that his back pain has not exceeded a 2/10 over the course of a 1 week period indicating improved tolerance to daily activities.    Baseline no pain in the last 1.5 weeks    Time 4    Period Weeks    Status Achieved      PT LONG TERM GOAL #3   Title Patient will deny any tenderness to palpation through lower back and gluteals indicating decreased sensitization and for improved overall daily mobility.    Time  4    Period Weeks    Status Achieved                 Plan - 03/22/20 1608    Clinical Impression Statement Patient presents to therapy for reassessment on this date. He has met all short and long term goals and has exceeded predicted FOTO score. Answered all questions and reviewed HEP prior to end of session. Patient to discharge from PT to HEP secondary to progress made.    Personal Factors and Comorbidities Age    Examination-Activity Limitations Bend;Carry;Sit    Examination-Participation Restrictions Yard Work;Community Activity;Driving    Stability/Clinical Decision Making Stable/Uncomplicated    Rehab Potential Good    PT Frequency 2x / week    PT Duration 4 weeks    PT Treatment/Interventions ADLs/Self Care Home Management;Aquatic Therapy;Cryotherapy;Electrical Stimulation;Moist Heat;Traction;DME Instruction;Gait training;Stair training;Functional mobility training;Therapeutic activities;Therapeutic exercise;Balance training;Neuromuscular re-education;Patient/family education;Orthotic Fit/Training;Manual techniques;Passive range of motion;Dry needling;Taping    PT Next Visit Plan pt to discharge from PT to HEP at this time    PT Home Exercise Plan 02/27/20: POE   6/3:  standing hip excursions (3 way); bridge and prone SLR           Patient will benefit from skilled therapeutic intervention in order to improve the following deficits and impairments:  Pain, Decreased activity tolerance, Decreased endurance, Decreased range of motion, Decreased strength  Visit Diagnosis: Low back pain, unspecified back pain laterality, unspecified chronicity, unspecified whether sciatica present  Muscle weakness (generalized)     Problem List Patient Active Problem List   Diagnosis Date Noted  . Allergic rhinitis 04/02/2017  . Prostate cancer screening 03/27/2016  . Family history of premature coronary artery disease 11/10/2013  . Calculus of ureter 08/31/2013  . Hyperlipidemia  11/27/2012  . Gilbert's syndrome    4:29 PM, 03/22/20 Devin Gross, DPT Physical Therapy with [facility]  [phone] office  [facility] [address] [place], [place], [address] Phone: [phone]   Fax:  [phone]  Name: Devin Gross MRN: [id number] Date of Birth: Jun 02, 1966

## 2020-03-23 ENCOUNTER — Ambulatory Visit (HOSPITAL_COMMUNITY): Payer: 59 | Admitting: Physical Therapy

## 2020-03-29 ENCOUNTER — Encounter (HOSPITAL_COMMUNITY): Payer: 59 | Admitting: Physical Therapy

## 2020-03-30 ENCOUNTER — Encounter (HOSPITAL_COMMUNITY): Payer: 59 | Admitting: Physical Therapy

## 2020-05-28 ENCOUNTER — Other Ambulatory Visit: Payer: Self-pay | Admitting: Family Medicine

## 2020-06-13 ENCOUNTER — Telehealth: Payer: Self-pay | Admitting: Family Medicine

## 2020-06-13 NOTE — Telephone Encounter (Signed)
CB# [phone]   Can Dr Buelah Manis prescribe nasal congestion

## 2020-06-14 NOTE — Telephone Encounter (Signed)
Call placed to patient. LMTRC.  

## 2020-06-14 NOTE — Telephone Encounter (Signed)
Received call from patient.   Reports that he has increased nasal congestion.   Advised to use OTC Mucinex or Robitussin for cough and nasal saline for congestion. Advised to increase rest and to increase fluid intake. Recommended that if symptoms worsen or persist >1 week to contact office for visit.

## 2020-09-17 ENCOUNTER — Other Ambulatory Visit: Payer: Self-pay

## 2020-09-17 ENCOUNTER — Encounter (HOSPITAL_COMMUNITY): Payer: Self-pay | Admitting: Emergency Medicine

## 2020-09-17 ENCOUNTER — Emergency Department (HOSPITAL_COMMUNITY)
Admission: EM | Admit: 2020-09-17 | Discharge: 2020-09-17 | Disposition: A | Discharge status: Home / Self Care | Payer: 59 | Attending: Emergency Medicine | Admitting: Emergency Medicine

## 2020-09-17 DIAGNOSIS — R197 Diarrhea, unspecified: Secondary | ICD-10-CM | POA: Insufficient documentation

## 2020-09-17 DIAGNOSIS — R112 Nausea with vomiting, unspecified: Secondary | ICD-10-CM | POA: Diagnosis not present

## 2020-09-17 LAB — CBC WITH DIFFERENTIAL/PLATELET
Abs Immature Granulocytes: 0.02 10*3/uL (ref 0.00–0.07)
Basophils Absolute: 0 10*3/uL (ref 0.0–0.1)
Basophils Relative: 0 %
Eosinophils Absolute: 0 10*3/uL (ref 0.0–0.5)
Eosinophils Relative: 0 %
HCT: 47.8 % (ref 39.0–52.0)
Hemoglobin: 14.5 g/dL (ref 13.0–17.0)
Immature Granulocytes: 0 %
Lymphocytes Relative: 14 %
Lymphs Abs: 1.1 10*3/uL (ref 0.7–4.0)
MCH: 27.4 pg (ref 26.0–34.0)
MCHC: 30.3 g/dL (ref 30.0–36.0)
MCV: 90.2 fL (ref 80.0–100.0)
Monocytes Absolute: 0.5 10*3/uL (ref 0.1–1.0)
Monocytes Relative: 6 %
Neutro Abs: 6.3 10*3/uL (ref 1.7–7.7)
Neutrophils Relative %: 80 %
Platelets: 198 10*3/uL (ref 150–400)
RBC: 5.3 MIL/uL (ref 4.22–5.81)
RDW: 14 % (ref 11.5–15.5)
WBC: 7.9 10*3/uL (ref 4.0–10.5)
nRBC: 0 % (ref 0.0–0.2)

## 2020-09-17 LAB — COMPREHENSIVE METABOLIC PANEL
ALT: 290 U/L — ABNORMAL HIGH (ref 0–44)
AST: 104 U/L — ABNORMAL HIGH (ref 15–41)
Albumin: 4.7 g/dL (ref 3.5–5.0)
Alkaline Phosphatase: 84 U/L (ref 38–126)
Anion gap: 12 (ref 5–15)
BUN: 9 mg/dL (ref 6–20)
CO2: 23 mmol/L (ref 22–32)
Calcium: 9.6 mg/dL (ref 8.9–10.3)
Chloride: 105 mmol/L (ref 98–111)
Creatinine, Ser: 0.72 mg/dL (ref 0.61–1.24)
GFR, Estimated: >60 mL/min (ref >60)
Glucose, Bld: 87 mg/dL (ref 70–99)
Potassium: 4 mmol/L (ref 3.5–5.1)
Sodium: 140 mmol/L (ref 135–145)
Total Bilirubin: 2.2 mg/dL — ABNORMAL HIGH (ref 0.3–1.2)
Total Protein: 7.7 g/dL (ref 6.5–8.1)

## 2020-09-17 LAB — LIPASE, BLOOD: Lipase: 30 U/L (ref 11–51)

## 2020-09-17 MED ORDER — SODIUM CHLORIDE 0.9 % IV BOLUS
1000.0000 mL | Freq: Once | INTRAVENOUS | Status: AC
  Administered 2020-09-17: 1000 mL via INTRAVENOUS

## 2020-09-17 MED ORDER — ONDANSETRON 4 MG PO TBDP: ORAL_TABLET | ORAL | 0 refills | Status: DC

## 2020-09-17 MED ORDER — ONDANSETRON HCL 4 MG/2ML IJ SOLN
4.0000 mg | Freq: Once | INTRAMUSCULAR | Status: AC
  Administered 2020-09-17: 4 mg via INTRAVENOUS
  Filled 2020-09-17: qty 2

## 2020-09-17 NOTE — Discharge Instructions (Addendum)
Drink plenty of fluids and follow-up with your family doctor if not improving 

## 2020-09-17 NOTE — ED Triage Notes (Signed)
Pt states he was at work and got dizzy, nauseous and started vomitting. Pt was seen by urgent care and they advised him to come here.

## 2020-09-17 NOTE — ED Provider Notes (Signed)
[facility] EMERGENCY DEPARTMENT Provider Note   CSN: [id number] Arrival date & time: 09/17/20  1427     History Chief Complaint  Patient presents with   Emesis    Devin Gross is a 54 y.o. male.  Patient complains of nausea and vomiting.  Patient has no pain no fever no chills patient has not been vomiting any blood  The history is provided by the patient and medical records. No language interpreter was used.  Emesis Severity:  Mild Timing:  Constant Quality:  Bilious material Able to tolerate:  Liquids Progression:  Improving Chronicity:  New Recent urination:  Normal Relieved by:  Nothing Worsened by:  Nothing Ineffective treatments:  None tried Associated symptoms: diarrhea   Associated symptoms: no abdominal pain, no cough and no headaches        Past Medical History:  Diagnosis Date   Calculus of ureter 08/31/2013   Gilbert's syndrome    Hypercholesteremia     Patient Active Problem List   Diagnosis Date Noted   Allergic rhinitis 04/02/2017   Prostate cancer screening 03/27/2016   Family history of premature coronary artery disease 11/10/2013   Calculus of ureter 08/31/2013   Hyperlipidemia 11/27/2012   Gilbert's syndrome     Past Surgical History:  Procedure Laterality Date   COLONOSCOPY N/A 12/28/2017   Procedure: COLONOSCOPY;  Surgeon: Danie Binder, MD;  [facility];  Service: Endoscopy;  Laterality: N/A;  12:00pm   EYE SURGERY         Family History  Problem Relation Age of Onset   Heart disease Father 10       CABG no prior hx   Cancer Father        prostate cancer    Hypertension Brother    Colon polyps Brother        age 59   Hypertension Maternal Grandmother    Colon cancer Neg Hx     Social History   Tobacco Use   Smoking status: Never Smoker   Smokeless tobacco: Never Used  Vaping Use   Vaping Use: Never used  Substance Use Topics   Alcohol use: No   Drug use: No    Home  Medications Prior to Admission medications   Medication Sig Start Date End Date Taking? Authorizing Provider  acetaminophen (TYLENOL) 500 MG tablet Take 500 mg by mouth every 6 (six) hours as needed.   Yes [provider]  atorvastatin (LIPITOR) 20 MG tablet TAKE 1 TABLET BY MOUTH AT BEDTIME. 05/28/20  Yes [place], Modena Nunnery, MD  cetirizine (ZYRTEC) 10 MG tablet Take 1 tablet (10 mg total) by mouth daily. Patient taking differently: Take 10 mg by mouth daily as needed (for seasonal allergies.). 04/02/17  Yes Orlena Sheldon, PA-C  cyclobenzaprine (FLEXERIL) 10 MG tablet Take 1 tablet (10 mg total) by mouth 3 (three) times daily as needed for muscle spasms. 03/13/20  Yes [place], Modena Nunnery, MD  meloxicam (MOBIC) 7.5 MG tablet Take 1 tablet (7.5 mg total) by mouth daily as needed for pain. 03/13/20  Yes [place], Modena Nunnery, MD  ondansetron (ZOFRAN ODT) 4 MG disintegrating tablet 4mg  ODT q4 hours prn nausea/vomit 09/17/20   Milton Ferguson, MD    Allergies    Patient has no known allergies.  Review of Systems   Review of Systems  Constitutional: Negative for appetite change and fatigue.  HENT: Negative for congestion, ear discharge and sinus pressure.   Eyes: Negative for discharge.  Respiratory: Negative  for cough.   Cardiovascular: Negative for chest pain.  Gastrointestinal: Positive for diarrhea and vomiting. Negative for abdominal pain.  Genitourinary: Negative for frequency and hematuria.  Musculoskeletal: Negative for back pain.  Skin: Negative for rash.  Neurological: Negative for seizures and headaches.  Psychiatric/Behavioral: Negative for hallucinations.    Physical Exam Updated Vital Signs BP (!) 143/83    Pulse (!) 50    Temp 97.8 F (36.6 C) (Oral)    Resp 14    Ht 6' (1.829 m)    Wt 77.1 kg    SpO2 100%    BMI 23.06 kg/m   Physical Exam Vitals and nursing note reviewed.  Constitutional:      Appearance: He is well-developed.  HENT:     Head: Normocephalic.     Nose:  Nose normal.  Eyes:     General: No scleral icterus.    Extraocular Movements: EOM normal.     Conjunctiva/sclera: Conjunctivae normal.  Neck:     Thyroid: No thyromegaly.  Cardiovascular:     Rate and Rhythm: Normal rate and regular rhythm.     Heart sounds: No murmur heard. No friction rub. No gallop.   Pulmonary:     Breath sounds: No stridor. No wheezing or rales.  Chest:     Chest wall: No tenderness.  Abdominal:     General: There is no distension.     Tenderness: There is no abdominal tenderness. There is no rebound.  Musculoskeletal:        General: No edema. Normal range of motion.     Cervical back: Neck supple.  Lymphadenopathy:     Cervical: No cervical adenopathy.  Skin:    Findings: No erythema or rash.  Neurological:     Mental Status: He is alert and oriented to person, place, and time.     Motor: No abnormal muscle tone.     Coordination: Coordination normal.  Psychiatric:        Mood and Affect: Mood and affect normal.        Behavior: Behavior normal.     ED Results / Procedures / Treatments   Labs (all labs ordered are listed, but only abnormal results are displayed) Labs Reviewed  COMPREHENSIVE METABOLIC PANEL - Abnormal; Notable for the following components:      Result Value   AST 104 (*)    ALT 290 (*)    Total Bilirubin 2.2 (*)    All other components within normal limits  CBC WITH DIFFERENTIAL/PLATELET  LIPASE, BLOOD    EKG None  Radiology No results found.  Procedures Procedures (including critical care time)  Medications Ordered in ED Medications  sodium chloride 0.9 % bolus 1,000 mL (0 mLs Intravenous Stopped 09/17/20 2021)  ondansetron (ZOFRAN) injection 4 mg (4 mg Intravenous Given 09/17/20 1840)    ED Course  I have reviewed the triage vital signs and the nursing notes.  Pertinent labs & imaging results that were available during my care of the patient were reviewed by me and considered in my medical decision making  (see chart for details).    MDM Rules/Calculators/A&P                          Labs unremarkable.  Patient improved with nausea medicine and fluids.Marland Kitchen  He will be sent home with Zofran follow-up with PCP as needed Final Clinical Impression(s) / ED Diagnoses Final diagnoses:  Non-intractable vomiting with nausea, unspecified vomiting type  Rx / DC Orders ED Discharge Orders         Ordered    ondansetron (ZOFRAN ODT) 4 MG disintegrating tablet        09/17/20 2035           Milton Ferguson, MD 09/21/20 [phone]

## 2020-10-15 ENCOUNTER — Ambulatory Visit (INDEPENDENT_AMBULATORY_CARE_PROVIDER_SITE_OTHER): Payer: 59 | Admitting: Nurse Practitioner

## 2020-10-15 ENCOUNTER — Telehealth: Payer: Self-pay | Admitting: *Deleted

## 2020-10-15 DIAGNOSIS — J0140 Acute pansinusitis, unspecified: Secondary | ICD-10-CM

## 2020-10-15 HISTORY — DX: Acute pansinusitis, unspecified: J01.40

## 2020-10-15 MED ORDER — GUAIFENESIN ER 600 MG PO TB12: 600.0000 mg | ORAL_TABLET | Freq: Two times a day (BID) | ORAL | 0 refills | Status: DC | PRN

## 2020-10-15 MED ORDER — AMOXICILLIN-POT CLAVULANATE 875-125 MG PO TABS: 1.0000 | ORAL_TABLET | Freq: Two times a day (BID) | ORAL | 0 refills | Status: AC

## 2020-10-15 NOTE — Telephone Encounter (Signed)
Noted.  Agree with disposition - thank you for schedule appointment.

## 2020-10-15 NOTE — Patient Instructions (Signed)
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
## 2020-10-15 NOTE — Telephone Encounter (Signed)
Received call from patient.   Reports that he has been having Sx x1 week. Reports cough, chest congestion and slight fever. States that other Sx have improved, but he is not able to break up chest congestion. States that he is taking Dayquil and Mucinex.  Appointment scheduled for Telehealth visit.

## 2020-10-15 NOTE — Assessment & Plan Note (Signed)
Acute, ongoing x 7 days.  COVID test negative with workplace.  Will treat for bacterial sinusitis with Augmentin bid x 7 days.  Continue supportive care with guaifenesin to help loosen secretions and increased hydration.  Start nasal rinses/nasal sprays to help also.  If symptoms persist, return to clinic.

## 2020-11-26 ENCOUNTER — Encounter: Payer: Self-pay | Admitting: Internal Medicine

## 2020-11-27 ENCOUNTER — Telehealth: Payer: Self-pay | Admitting: *Deleted

## 2020-11-27 NOTE — Telephone Encounter (Signed)
Received call from patient.   Reports that he has cough and chest congestion. Requested ABTx. Advised that patient will require OV for ABTx. States that he will call back to schedule.   Advised to continue Sx management with OTC medications. Advised if chest pain, SOB, high fever unresponsive to antipyretics noted, go to ER for evaluation.

## 2020-11-28 NOTE — Telephone Encounter (Signed)
Agree with symptom treatment

## 2021-01-10 ENCOUNTER — Encounter: Payer: Self-pay | Admitting: Nurse Practitioner

## 2021-01-10 ENCOUNTER — Ambulatory Visit (INDEPENDENT_AMBULATORY_CARE_PROVIDER_SITE_OTHER): Payer: 59 | Admitting: Nurse Practitioner

## 2021-01-10 ENCOUNTER — Other Ambulatory Visit: Payer: Self-pay

## 2021-01-10 VITALS — BP 130/70 | HR 53 | Temp 97.2°F | Wt 178.1 lb

## 2021-01-10 DIAGNOSIS — Z8249 Family history of ischemic heart disease and other diseases of the circulatory system: Secondary | ICD-10-CM | POA: Diagnosis not present

## 2021-01-10 DIAGNOSIS — E785 Hyperlipidemia, unspecified: Secondary | ICD-10-CM

## 2021-01-10 DIAGNOSIS — R7989 Other specified abnormal findings of blood chemistry: Secondary | ICD-10-CM

## 2021-01-10 NOTE — Assessment & Plan Note (Signed)
Chronic.  Currently taking atorvastatin 10 mg daily.  Is fasting today-we will check lipid levels and CMP.  Discussed ASCVD risk and given strong family history, our goal for LDL is less than 70.  Last LDL about 1 year ago was 111.  Will likely increase atorvastatin to 20 mg daily pending blood work.

## 2021-01-10 NOTE — Progress Notes (Signed)
Subjective:    Patient ID: Devin Gross, male    DOB: [birth year], 55 y.o.   MRN: [id number]  HPI: Devin Gross is a 55 y.o. male presenting for establish with new provider.  Chief Complaint  Patient presents with  . Establish Care   Patient Is here to establish care with new provider.  He previously saw a different provider in our same office.  He is fasting today.  HYPERLIPIDEMIA He does have a history of hyperlipidemia and takes atorvastatin 20 mg, however frequently cuts this in half and takes 10 mg daily.  He stays physically active by working in his yard push mowing and weed eating.  He does eat out for lunch often-sometimes does eat fried food, however tries eat boiled fish.  He does not pack his lunches.  He has a strong family history of early cardiovascular disease. Hyperlipidemia status: excellent compliance Satisfied with current treatment?  yes Side effects:  no Medication compliance: excellent compliance Past cholesterol meds: Atorvastatin 20 mg Aspirin:  no The 10-year ASCVD risk score Devin Gross DC Jr., et al., 2013) is: 6.1%   Values used to calculate the score:     Age: 52 years     Sex: Male     Is Non-Hispanic African American: Yes     Diabetic: No     Tobacco smoker: No     Systolic Blood Pressure: 099 mmHg     Is BP treated: No     HDL Cholesterol: 52 mg/dL     Total Cholesterol: 184 mg/dL Chest pain:  no Coronary artery disease:  no Family history CAD:  yes Family history early CAD:  yes  No Known Allergies  Outpatient Encounter Medications as of 01/10/2021  Medication Sig  . acetaminophen (TYLENOL) 500 MG tablet Take 500 mg by mouth every 6 (six) hours as needed.  Marland Kitchen atorvastatin (LIPITOR) 20 MG tablet TAKE 1 TABLET BY MOUTH AT BEDTIME.  . [DISCONTINUED] cetirizine (ZYRTEC) 10 MG tablet Take 1 tablet (10 mg total) by mouth daily. (Patient taking differently: Take 10 mg by mouth daily as needed (for seasonal allergies.).)  . [DISCONTINUED]  cyclobenzaprine (FLEXERIL) 10 MG tablet Take 1 tablet (10 mg total) by mouth 3 (three) times daily as needed for muscle spasms. (Patient not taking: Reported on 10/15/2020)  . [DISCONTINUED] guaiFENesin (MUCINEX) 600 MG 12 hr tablet Take 1 tablet (600 mg total) by mouth 2 (two) times daily as needed for cough or to loosen phlegm.  . [DISCONTINUED] meloxicam (MOBIC) 7.5 MG tablet Take 1 tablet (7.5 mg total) by mouth daily as needed for pain. (Patient not taking: Reported on 10/15/2020)   No facility-administered encounter medications on file as of 01/10/2021.    Patient Active Problem List   Diagnosis Date Noted  . Allergic rhinitis 04/02/2017  . Prostate cancer screening 03/27/2016  . Family history of premature coronary artery disease 11/10/2013  . Hyperlipidemia 11/27/2012  . Gilbert's syndrome     Past Medical History:  Diagnosis Date  . Acute non-recurrent pansinusitis 10/15/2020  . Calculus of ureter 08/31/2013  . Gilbert's syndrome   . Hypercholesteremia     Relevant past medical, surgical, family and social history reviewed and updated as indicated. Interim medical history since our last visit reviewed.  Review of Systems Per HPI unless specifically indicated above     Objective:    BP 130/70   Pulse (!) 53   Temp (!) 97.2 F (36.2 C) (Temporal)   Wt 178 lb  1.6 oz (80.8 kg)   SpO2 98%   BMI 24.15 kg/m   Wt Readings from Last 3 Encounters:  01/10/21 178 lb 1.6 oz (80.8 kg)  09/17/20 170 lb (77.1 kg)  03/13/20 170 lb (77.1 kg)    Physical Exam Vitals reviewed.  Constitutional:      General: He is not in acute distress.    Appearance: Normal appearance. He is not toxic-appearing.  Neck:     Vascular: No carotid bruit.  Cardiovascular:     Rate and Rhythm: Regular rhythm. Bradycardia present.     Heart sounds: Normal heart sounds. No murmur heard.   Pulmonary:     Effort: Pulmonary effort is normal. No respiratory distress.     Breath sounds: Normal breath  sounds. No wheezing, rhonchi or rales.  Musculoskeletal:     Cervical back: Normal range of motion.  Skin:    General: Skin is warm and dry.     Coloration: Skin is not jaundiced or pale.     Findings: No erythema.  Neurological:     Mental Status: He is alert and oriented to person, place, and time.  Psychiatric:        Mood and Affect: Mood normal.        Behavior: Behavior normal.        Thought Content: Thought content normal.        Judgment: Judgment normal.         Assessment & Plan:   Problem List Items Addressed This Visit      Other   Hyperlipidemia    Chronic.  Currently taking atorvastatin 10 mg daily.  Is fasting today-we will check lipid levels and CMP.  Discussed ASCVD risk and given strong family history, our goal for LDL is less than 70.  Last LDL about 1 year ago was 111.  Will likely increase atorvastatin to 20 mg daily pending blood work.      Relevant Orders   Lipid Panel   CBC with Differential   Family history of premature coronary artery disease    Discussed coupled with elevated LDL, patient is at higher risk for cardiovascular disease.  He is currently on a statin but is cutting it in half.  Will check fasting lipids today along with CMP.  If LDL remains greater than 70, will encourage taking 20 mg daily and repeating in about 6 months.       Other Visit Diagnoses    Elevated liver function tests    -  Primary   Relevant Orders   COMPLETE METABOLIC PANEL WITH GFR   CBC with Differential       Follow up plan: Return in about 3 weeks (around 01/31/2021) for CPE .

## 2021-01-10 NOTE — Patient Instructions (Signed)
Physical 4/28 @ 3:30

## 2021-01-10 NOTE — Assessment & Plan Note (Signed)
Discussed coupled with elevated LDL, patient is at higher risk for cardiovascular disease.  He is currently on a statin but is cutting it in half.  Will check fasting lipids today along with CMP.  If LDL remains greater than 70, will encourage taking 20 mg daily and repeating in about 6 months.

## 2021-01-11 LAB — CBC WITH DIFFERENTIAL/PLATELET
Absolute Monocytes: 352 cells/uL (ref 200–950)
Basophils Absolute: 31 cells/uL (ref 0–200)
Basophils Relative: 0.7 %
Eosinophils Absolute: 88 cells/uL (ref 15–500)
Eosinophils Relative: 2 %
HCT: 44.3 % (ref 38.5–50.0)
Hemoglobin: 14.2 g/dL (ref 13.2–17.1)
Lymphs Abs: 2130 cells/uL (ref 850–3900)
MCH: 27.3 pg (ref 27.0–33.0)
MCHC: 32.1 g/dL (ref 32.0–36.0)
MCV: 85 fL (ref 80.0–100.0)
MPV: 10.4 fL (ref 7.5–12.5)
Monocytes Relative: 8 %
Neutro Abs: 1800 cells/uL (ref 1500–7800)
Neutrophils Relative %: 40.9 %
Platelets: 228 10*3/uL (ref 140–400)
RBC: 5.21 10*6/uL (ref 4.20–5.80)
RDW: 12.7 % (ref 11.0–15.0)
Total Lymphocyte: 48.4 %
WBC: 4.4 10*3/uL (ref 3.8–10.8)

## 2021-01-11 LAB — LIPID PANEL
Cholesterol: 178 mg/dL (ref <200)
HDL: 50 mg/dL (ref >=40)
LDL Cholesterol (Calc): 107 mg/dL (calc) — ABNORMAL HIGH
Non-HDL Cholesterol (Calc): 128 mg/dL (calc) (ref <130)
Total CHOL/HDL Ratio: 3.6 (calc) (ref <5.0)
Triglycerides: 114 mg/dL (ref <150)

## 2021-01-11 LAB — COMPLETE METABOLIC PANEL WITH GFR
AG Ratio: 1.8 (calc) (ref 1.0–2.5)
ALT: 21 U/L (ref 9–46)
AST: 20 U/L (ref 10–35)
Albumin: 4.7 g/dL (ref 3.6–5.1)
Alkaline phosphatase (APISO): 64 U/L (ref 35–144)
BUN: 11 mg/dL (ref 7–25)
CO2: 23 mmol/L (ref 20–32)
Calcium: 9.5 mg/dL (ref 8.6–10.3)
Chloride: 106 mmol/L (ref 98–110)
Creat: 0.77 mg/dL (ref 0.70–1.33)
GFR, Est African American: 120 mL/min/{1.73_m2} (ref >=60)
GFR, Est Non African American: 104 mL/min/{1.73_m2} (ref >=60)
Globulin: 2.6 g/dL (calc) (ref 1.9–3.7)
Glucose, Bld: 80 mg/dL (ref 65–99)
Potassium: 3.9 mmol/L (ref 3.5–5.3)
Sodium: 141 mmol/L (ref 135–146)
Total Bilirubin: 1.9 mg/dL — ABNORMAL HIGH (ref 0.2–1.2)
Total Protein: 7.3 g/dL (ref 6.1–8.1)

## 2021-01-31 ENCOUNTER — Other Ambulatory Visit: Payer: Self-pay

## 2021-01-31 ENCOUNTER — Encounter: Payer: Self-pay | Admitting: Nurse Practitioner

## 2021-01-31 ENCOUNTER — Ambulatory Visit: Payer: 59 | Admitting: Nurse Practitioner

## 2021-01-31 VITALS — BP 120/80 | HR 54 | Temp 97.4°F | Ht 72.0 in | Wt 164.8 lb

## 2021-01-31 DIAGNOSIS — Z0000 Encounter for general adult medical examination without abnormal findings: Secondary | ICD-10-CM

## 2021-01-31 DIAGNOSIS — Z125 Encounter for screening for malignant neoplasm of prostate: Secondary | ICD-10-CM | POA: Diagnosis not present

## 2021-01-31 NOTE — Progress Notes (Signed)
BP 120/80   Pulse (!) 54   Temp (!) 97.4 F (36.3 C)   Ht 6' (1.829 m)   Wt 164 lb 12.8 oz (74.8 kg)   SpO2 98%   BMI 22.35 kg/m    Subjective:    Patient ID: Devin Gross, male    DOB: 1966/03/02, 55 y.o.   MRN: [id number]  HPI: Devin Gross is a 55 y.o. male presenting on 01/31/2021 for comprehensive medical examination. Current medical complaints include:none   Patient lives with mother.  Stays active doing yard work and cutting wood for [employer].  Interim Problems from his last visit: no  Depression Screen done today and results listed below:  Depression screen [facility] 2/9 01/31/2021 01/31/2020 11/10/2018 10/09/2017 04/02/2017  Decreased Interest 0 0 0 0 0  Down, Depressed, Hopeless 0 0 0 0 0  PHQ - 2 Score 0 0 0 0 0  Altered sleeping - 0 - - 0  Tired, decreased energy - 0 - - 0  Change in appetite - 0 - - 0  Feeling bad or failure about yourself  - 0 - - 0  Trouble concentrating - 0 - - 0  Moving slowly or fidgety/restless - 0 - - 0  Suicidal thoughts - 0 - - 0  PHQ-9 Score - 0 - - 0  Difficult doing work/chores - Not difficult at all - - Not difficult at all    The patient does not have a history of falls. I did not complete a risk assessment for falls. A plan of care for falls was not documented.   Past Medical History:  Past Medical History:  Diagnosis Date  . Acute non-recurrent pansinusitis 10/15/2020  . Calculus of ureter 08/31/2013  . Gilbert's syndrome   . Hypercholesteremia     Surgical History:  Past Surgical History:  Procedure Laterality Date  . COLONOSCOPY N/A 12/28/2017   Procedure: COLONOSCOPY;  Surgeon: Danie Binder, MD;  Location: [facility];  Service: Endoscopy;  Laterality: N/A;  12:00pm  . EYE SURGERY      Medications:  Current Outpatient Medications on File Prior to Visit  Medication Sig  . acetaminophen (TYLENOL) 500 MG tablet Take 500 mg by mouth every 6 (six) hours as needed.  Marland Kitchen atorvastatin (LIPITOR) 20 MG  tablet TAKE 1 TABLET BY MOUTH AT BEDTIME.   No current facility-administered medications on file prior to visit.    Allergies:  No Known Allergies  Social History:  Social History   Socioeconomic History  . Marital status: Single    Spouse name: Not on file  . Number of children: Not on file  . Years of education: Not on file  . Highest education level: Not on file  Occupational History    Employer: [employer]  Tobacco Use  . Smoking status: Never Smoker  . Smokeless tobacco: Never Used  Vaping Use  . Vaping Use: Never used  Substance and Sexual Activity  . Alcohol use: No  . Drug use: No  . Sexual activity: Not on file  Other Topics Concern  . Not on file  Social History Narrative  . Not on file   Social Determinants of Health   Financial Resource Strain: Not on file  Food Insecurity: Not on file  Transportation Needs: Not on file  Physical Activity: Not on file  Stress: Not on file  Social Connections: Not on file  Intimate Partner Violence: Not on file   Social History  Tobacco Use  Smoking Status Never Smoker  Smokeless Tobacco Never Used   Social History   Substance and Sexual Activity  Alcohol Use No    Family History:  Family History  Problem Relation Age of Onset  . Heart disease Father 2       CABG no prior hx  . Cancer Father        prostate cancer   . Hypertension Brother   . Colon polyps Brother        age 31  . Hypertension Maternal Grandmother   . Colon cancer Neg Hx     Past medical history, surgical history, medications, allergies, family history and social history reviewed with patient today and changes made to appropriate areas of the chart.   Review of Systems  Constitutional: Negative.   HENT: Negative.   Eyes: Negative.   Respiratory: Negative.   Cardiovascular: Negative.   Gastrointestinal: Negative.   Genitourinary: Negative.   Musculoskeletal: Negative.   Skin: Negative.   Neurological: Negative.    Psychiatric/Behavioral: Negative.        Objective:    BP 120/80   Pulse (!) 54   Temp (!) 97.4 F (36.3 C)   Ht 6' (1.829 m)   Wt 164 lb 12.8 oz (74.8 kg)   SpO2 98%   BMI 22.35 kg/m   Wt Readings from Last 3 Encounters:  01/31/21 164 lb 12.8 oz (74.8 kg)  01/10/21 178 lb 1.6 oz (80.8 kg)  09/17/20 170 lb (77.1 kg)    Physical Exam Constitutional:      General: He is not in acute distress.    Appearance: Normal appearance. He is normal weight. He is not toxic-appearing.  HENT:     Head: Normocephalic and atraumatic.     Right Ear: Tympanic membrane, ear canal and external ear normal. There is impacted cerumen.     Left Ear: Tympanic membrane, ear canal and external ear normal. There is no impacted cerumen.     Nose: Nose normal. No congestion or rhinorrhea.     Mouth/Throat:     Mouth: Mucous membranes are moist.     Pharynx: Oropharynx is clear. No oropharyngeal exudate or posterior oropharyngeal erythema.  Eyes:     General: No scleral icterus.    Extraocular Movements: Extraocular movements intact.     Conjunctiva/sclera: Conjunctivae normal.     Pupils: Pupils are equal, round, and reactive to light.  Neck:     Vascular: No carotid bruit.  Cardiovascular:     Rate and Rhythm: Normal rate and regular rhythm.     Heart sounds: Normal heart sounds. No murmur heard. No gallop.   Pulmonary:     Effort: Pulmonary effort is normal. No respiratory distress.     Breath sounds: Normal breath sounds. No wheezing or rhonchi.  Abdominal:     General: Abdomen is flat. Bowel sounds are normal. There is no distension.     Palpations: Abdomen is soft. There is no mass.     Tenderness: There is no abdominal tenderness. There is no right CVA tenderness or left CVA tenderness.  Genitourinary:    Comments: Deferred using shared decision making Musculoskeletal:        General: No swelling or tenderness. Normal range of motion.     Cervical back: Normal range of motion and neck  supple. No tenderness.     Right lower leg: No edema.     Left lower leg: No edema.  Skin:    General:  Skin is warm and dry.     Capillary Refill: Capillary refill takes less than 2 seconds.     Coloration: Skin is not jaundiced or pale.  Neurological:     General: No focal deficit present.     Mental Status: He is alert and oriented to person, place, and time. Mental status is at baseline.  Psychiatric:        Mood and Affect: Mood normal.        Behavior: Behavior normal.        Thought Content: Thought content normal.        Judgment: Judgment normal.     Results for orders placed or performed in visit on 01/10/21  Lipid Panel  Result Value Ref Range   Cholesterol 178 <200 mg/dL   HDL 50 > OR = 40 mg/dL   Triglycerides 114 <150 mg/dL   LDL Cholesterol (Calc) 107 (H) mg/dL (calc)   Total CHOL/HDL Ratio 3.6 <5.0 (calc)   Non-HDL Cholesterol (Calc) 128 <130 mg/dL (calc)  COMPLETE METABOLIC PANEL WITH GFR  Result Value Ref Range   Glucose, Bld 80 65 - 99 mg/dL   BUN 11 7 - 25 mg/dL   Creat 0.77 0.70 - 1.33 mg/dL   GFR, Est Non African American 104 > OR = 60 mL/min/1.47m2   GFR, Est African American 120 > OR = 60 mL/min/1.54m2   BUN/Creatinine Ratio NOT APPLICABLE 6 - 22 (calc)   Sodium 141 135 - 146 mmol/L   Potassium 3.9 3.5 - 5.3 mmol/L   Chloride 106 98 - 110 mmol/L   CO2 23 20 - 32 mmol/L   Calcium 9.5 8.6 - 10.3 mg/dL   Total Protein 7.3 6.1 - 8.1 g/dL   Albumin 4.7 3.6 - 5.1 g/dL   Globulin 2.6 1.9 - 3.7 g/dL (calc)   AG Ratio 1.8 1.0 - 2.5 (calc)   Total Bilirubin 1.9 (H) 0.2 - 1.2 mg/dL   Alkaline phosphatase (APISO) 64 35 - 144 U/L   AST 20 10 - 35 U/L   ALT 21 9 - 46 U/L  CBC with Differential  Result Value Ref Range   WBC 4.4 3.8 - 10.8 Thousand/uL   RBC 5.21 4.20 - 5.80 Million/uL   Hemoglobin 14.2 13.2 - 17.1 g/dL   HCT 44.3 38.5 - 50.0 %   MCV 85.0 80.0 - 100.0 fL   MCH 27.3 27.0 - 33.0 pg   MCHC 32.1 32.0 - 36.0 g/dL   RDW 12.7 11.0 - 15.0 %    Platelets 228 140 - 400 Thousand/uL   MPV 10.4 7.5 - 12.5 fL   Neutro Abs 1,800 1,500 - 7,800 cells/uL   Lymphs Abs 2,130 850 - 3,900 cells/uL   Absolute Monocytes 352 200 - 950 cells/uL   Eosinophils Absolute 88 15 - 500 cells/uL   Basophils Absolute 31 0 - 200 cells/uL   Neutrophils Relative % 40.9 %   Total Lymphocyte 48.4 %   Monocytes Relative 8.0 %   Eosinophils Relative 2.0 %   Basophils Relative 0.7 %      Assessment & Plan:   Problem List Items Addressed This Visit      Other   Prostate cancer screening - Primary   Relevant Orders   PSA       Discussed aspirin prophylaxis for myocardial infarction prevention and decision was it was not indicated  LABORATORY TESTING:  Health maintenance labs ordered today as discussed above.   The natural history of prostate cancer  and ongoing controversy regarding screening and potential treatment outcomes of prostate cancer has been discussed with the patient. The meaning of a false positive PSA and a false negative PSA has been discussed. He indicates understanding of the limitations of this screening test and wishes to proceed with screening PSA testing.   IMMUNIZATIONS:   - Tdap: Tetanus vaccination status reviewed: last tetanus booster within 10 years. - Influenza: Up to date - Pneumovax: Not applicable - Prevnar: Not applicable - HPV: Not applicable - Zostavax vaccine: Not applicable - EUMPN-36 vaccine: fully vaccinated with 2 doses of Moderna and Pfizer booster  SCREENING: - Colonoscopy: Up to date  Discussed with patient purpose of the colonoscopy is to detect colon cancer at curable precancerous or early stages   - AAA Screening: Not applicable  -Hearing Test: Not applicable  -Spirometry: Not applicable   PATIENT COUNSELING:    Sexuality: Discussed sexually transmitted diseases, partner selection, use of condoms, avoidance of unintended pregnancy  and contraceptive alternatives.   Advised to avoid cigarette  smoking.  I discussed with the patient that most people either abstain from alcohol or drink within safe limits (<=14/week and <=4 drinks/occasion for males, <=7/weeks and <= 3 drinks/occasion for females) and that the risk for alcohol disorders and other health effects rises proportionally with the number of drinks per week and how often a drinker exceeds daily limits.  Discussed cessation/primary prevention of drug use and availability of treatment for abuse.   Diet: Encouraged to adjust caloric intake to maintain  or achieve ideal body weight, to reduce intake of dietary saturated fat and total fat, to limit sodium intake by avoiding high sodium foods and not adding table salt, and to maintain adequate dietary potassium and calcium preferably from fresh fruits, vegetables, and low-fat dairy products.    stressed the importance of regular exercise  Injury prevention: Discussed safety belts, safety helmets, smoke detector, smoking near bedding or upholstery.   Dental health: Discussed importance of regular tooth brushing, flossing, and dental visits.   Follow up plan: NEXT PREVENTATIVE PHYSICAL DUE IN 1 YEAR. Return in about 6 months (around 08/02/2021) for cholesterol.

## 2021-02-01 LAB — PSA: PSA: 0.87 ng/mL (ref <=4.00)

## 2021-02-23 IMAGING — DX DG LUMBAR SPINE COMPLETE 4+V
5 series · 5 of 5 positions shown · non-contrast
Comparison: None.

CLINICAL DATA: Chronic low back pain after lifting injury several
months ago.

EXAM:
LUMBAR SPINE - COMPLETE 4+ VIEW

[l-spine ap]
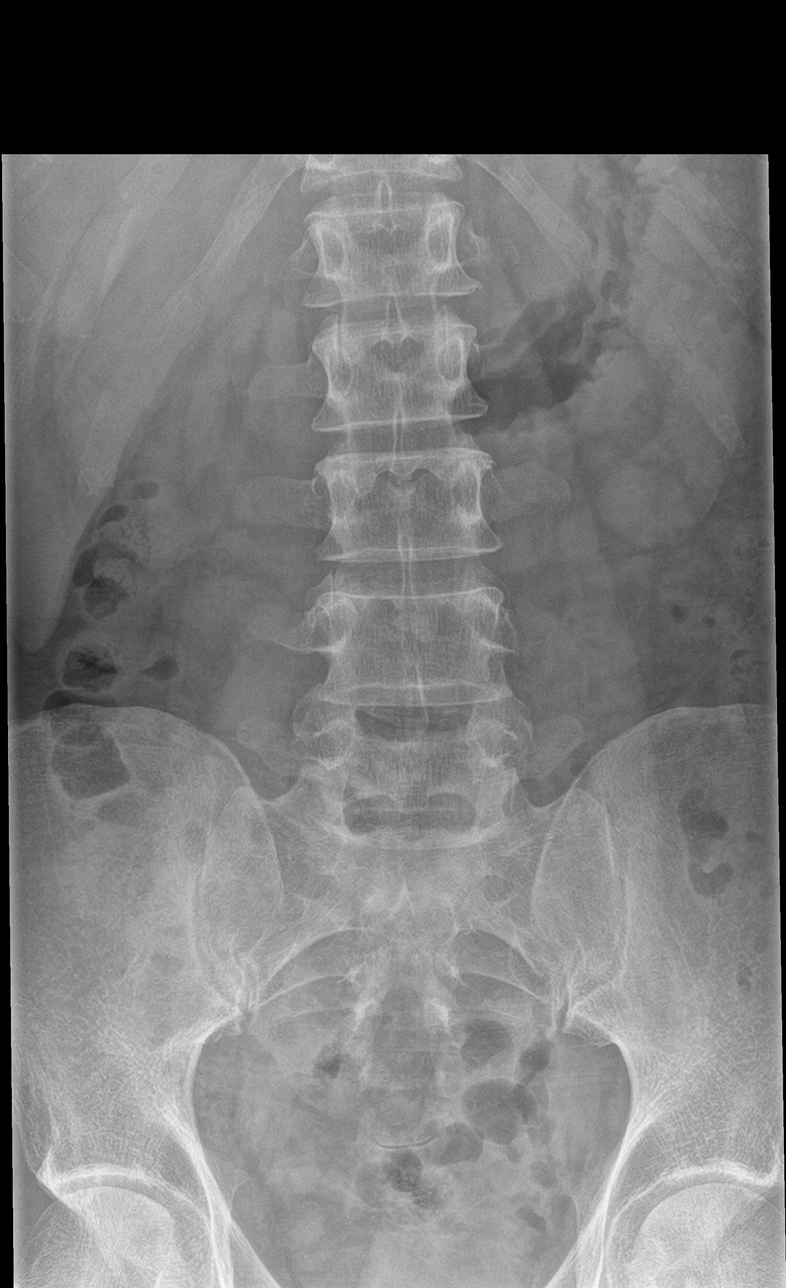

[l-spine obl (1 of 2)]
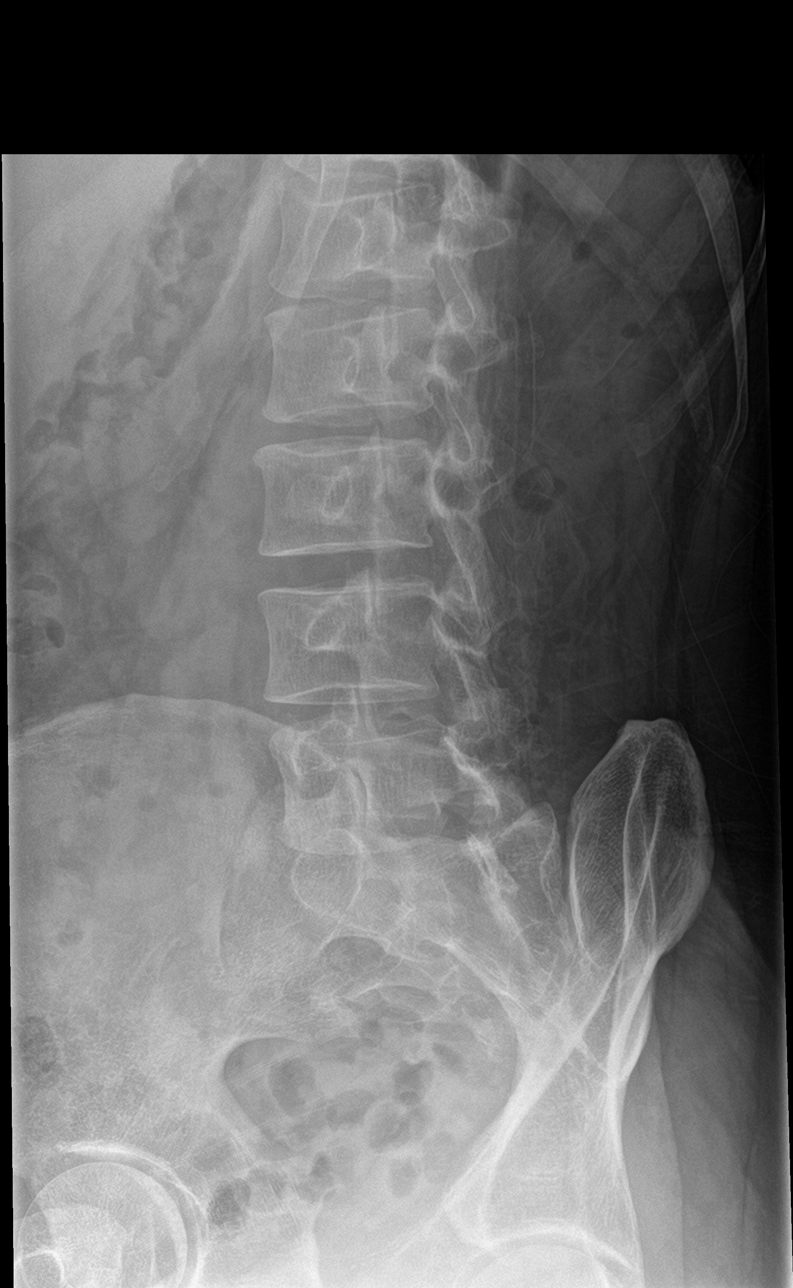

[l-spine obl (2 of 2)]
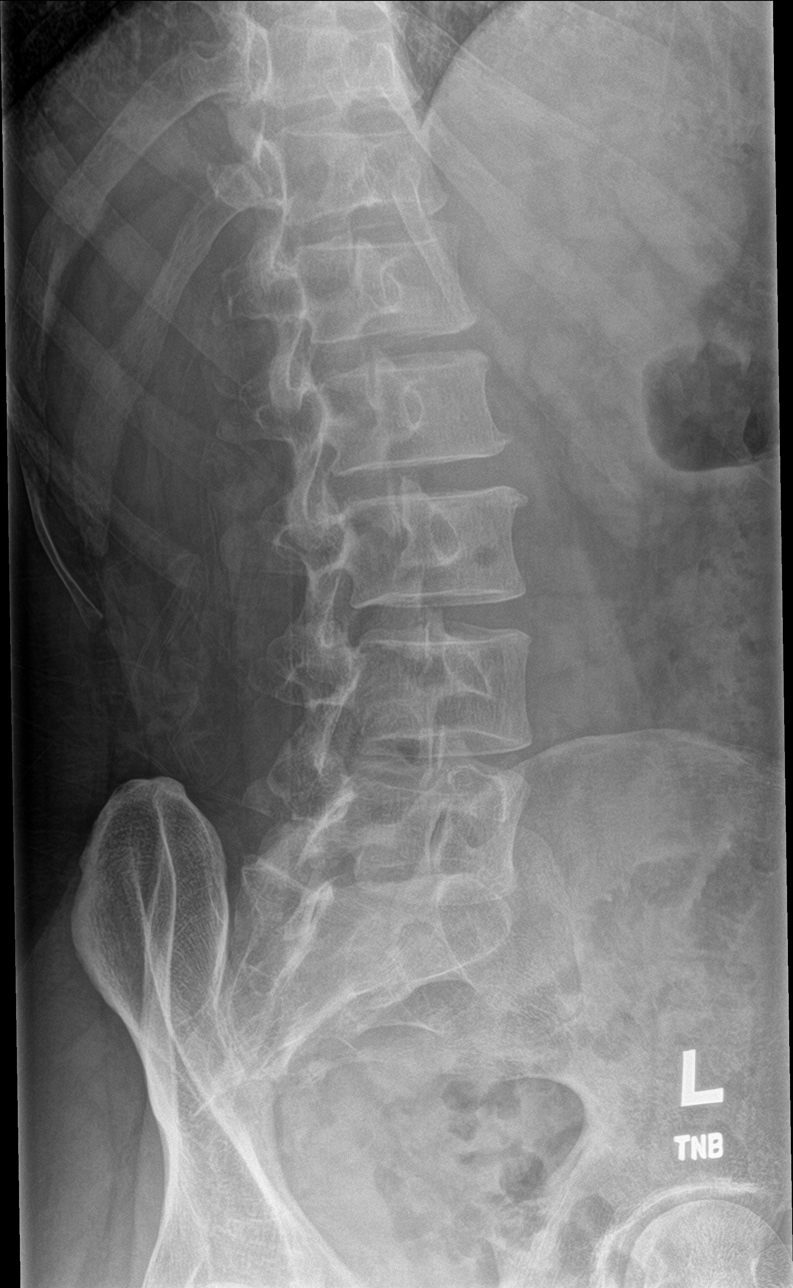

[l-spine lat]
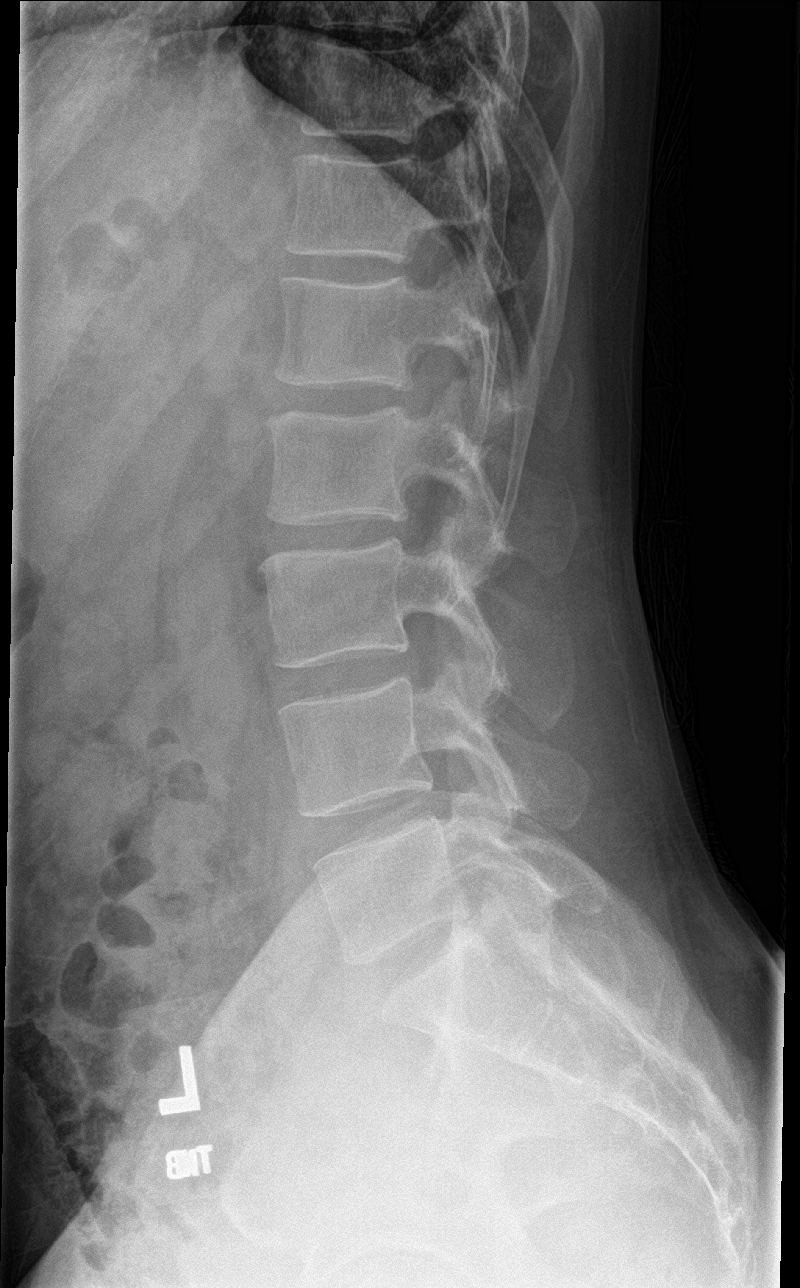

[l-spine spot]
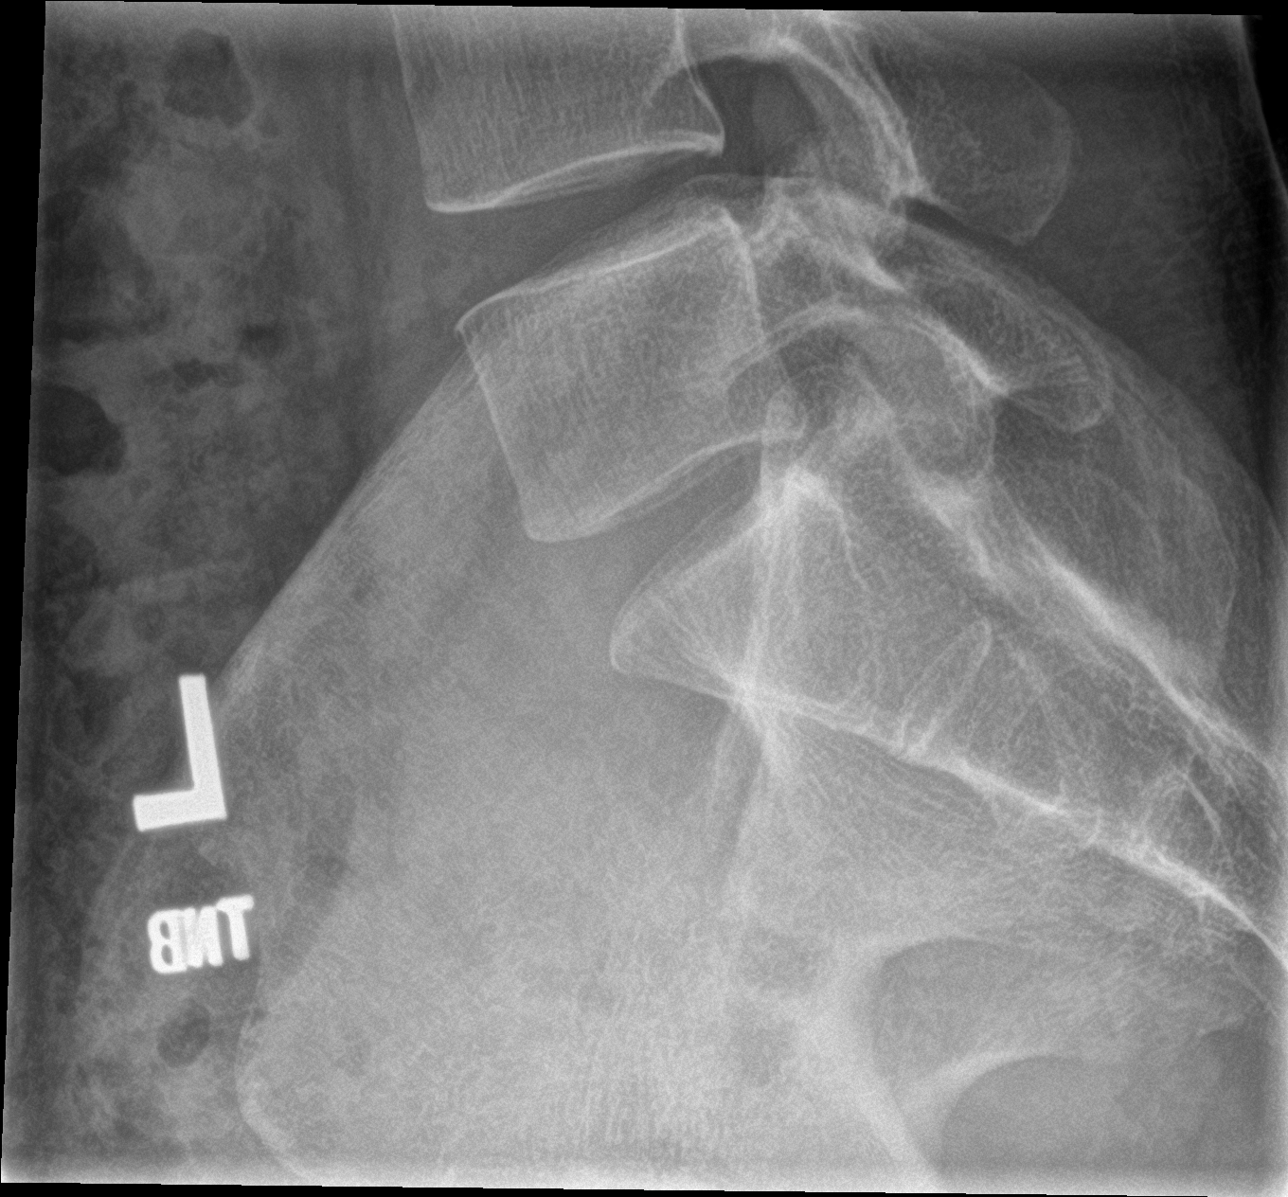

[5 of 5 positions shown; findings below may reference images not displayed]

FINDINGS: There is no evidence of lumbar spine fracture. Alignment is normal.
Intervertebral disc spaces are maintained.
IMPRESSION: Negative.

## 2021-03-27 ENCOUNTER — Telehealth: Payer: Self-pay

## 2021-03-27 ENCOUNTER — Encounter: Payer: Self-pay | Admitting: Internal Medicine

## 2021-03-27 ENCOUNTER — Other Ambulatory Visit: Payer: Self-pay

## 2021-03-27 ENCOUNTER — Ambulatory Visit: Payer: 59 | Admitting: Internal Medicine

## 2021-03-27 VITALS — BP 120/79 | HR 78 | Temp 96.9°F | Ht 73.0 in | Wt 165.8 lb

## 2021-03-27 DIAGNOSIS — D122 Benign neoplasm of ascending colon: Secondary | ICD-10-CM | POA: Diagnosis not present

## 2021-03-27 MED ORDER — CLENPIQ 10-3.5-12 MG-GM -GM/160ML PO SOLN: 1.0000 | Freq: Once | ORAL | 0 refills | Status: AC

## 2021-03-27 NOTE — Progress Notes (Signed)
Primary Care Physician:  Eulogio Bear, NP Primary Gastroenterologist:  Dr. Abbey Chatters  Chief Complaint  Patient presents with   Colonoscopy    HPI:   Devin Gross is a 55 y.o. male who presents to clinic today to discuss surveillance colonoscopy.  Last colonoscopy 2019 with 4 polyps removed, 3 of which were tubular adenomas.  No family history of colorectal malignancy.  No unintentional weight loss.  No melena hematochezia.  No complaints for me today.  Patient is healthy only takes atorvastatin for dyslipidemia.  Past Medical History:  Diagnosis Date   Acute non-recurrent pansinusitis 10/15/2020   Calculus of ureter 08/31/2013   Gilbert's syndrome    Hypercholesteremia     Past Surgical History:  Procedure Laterality Date   COLONOSCOPY N/A 12/28/2017   Procedure: COLONOSCOPY;  Surgeon: Danie Binder, MD;  [facility];  Service: Endoscopy;  Laterality: N/A;  12:00pm   EYE SURGERY      Current Outpatient Medications  Medication Sig Dispense Refill   acetaminophen (TYLENOL) 500 MG tablet Take 500 mg by mouth as needed.     atorvastatin (LIPITOR) 20 MG tablet TAKE 1 TABLET BY MOUTH AT BEDTIME. 30 tablet 3   No current facility-administered medications for this visit.    Allergies as of 03/27/2021   (No Known Allergies)    Family History  Problem Relation Age of Onset   Heart disease Father 38       CABG no prior hx   Cancer Father        prostate cancer    Hypertension Brother    Colon polyps Brother        age 42   Hypertension Maternal Grandmother    Colon cancer Neg Hx     Social History   Socioeconomic History   Marital status: Single    Spouse name: Not on file   Number of children: Not on file   Years of education: Not on file   Highest education level: Not on file  Occupational History    Employer: [employer]  Tobacco Use   Smoking status: Never   Smokeless tobacco: Never  Vaping Use   Vaping Use: Never used   Substance and Sexual Activity   Alcohol use: No   Drug use: No   Sexual activity: Not on file  Other Topics Concern   Not on file  Social History Narrative   Not on file   Social Determinants of Health   Financial Resource Strain: Not on file  Food Insecurity: Not on file  Transportation Needs: Not on file  Physical Activity: Not on file  Stress: Not on file  Social Connections: Not on file  Intimate Partner Violence: Not on file    Subjective: Review of Systems  Constitutional:  Negative for chills and fever.  HENT:  Negative for congestion and hearing loss.   Eyes:  Negative for blurred vision and double vision.  Respiratory:  Negative for cough and shortness of breath.   Cardiovascular:  Negative for chest pain and palpitations.  Gastrointestinal:  Negative for abdominal pain, blood in stool, constipation, diarrhea, heartburn, melena and vomiting.  Genitourinary:  Negative for dysuria and urgency.  Musculoskeletal:  Negative for joint pain and myalgias.  Skin:  Negative for itching and rash.  Neurological:  Negative for dizziness and headaches.  Psychiatric/Behavioral:  Negative for depression. The patient is not nervous/anxious.       Objective: BP 120/79   Pulse 78  Temp (!) 96.9 F (36.1 C) (Temporal)   Ht 6\' 1"  (1.854 m)   Wt 165 lb 12.8 oz (75.2 kg)   BMI 21.87 kg/m  Physical Exam Constitutional:      Appearance: Normal appearance.  HENT:     Head: Normocephalic and atraumatic.  Eyes:     Extraocular Movements: Extraocular movements intact.     Conjunctiva/sclera: Conjunctivae normal.  Cardiovascular:     Rate and Rhythm: Normal rate and regular rhythm.  Pulmonary:     Effort: Pulmonary effort is normal.     Breath sounds: Normal breath sounds.  Abdominal:     General: Bowel sounds are normal.     Palpations: Abdomen is soft.  Musculoskeletal:        General: Normal range of motion.     Cervical back: Normal range of motion and neck supple.   Skin:    General: Skin is warm.  Neurological:     General: No focal deficit present.     Mental Status: He is alert and oriented to person, place, and time.  Psychiatric:        Mood and Affect: Mood normal.        Behavior: Behavior normal.     Assessment: *Adenomatous colon polyps  Plan: Will schedule for surveillance colonoscopy.The risks including infection, bleed, or perforation as well as benefits, limitations, alternatives and imponderables have been reviewed with the patient. Questions have been answered. All parties agreeable.   03/27/2021 2:21 PM   Disclaimer: This note was dictated with voice recognition software. Similar sounding words can inadvertently be transcribed and may not be corrected upon review.

## 2021-03-27 NOTE — Patient Instructions (Signed)
We will schedule you for colonoscopy for surveillance purposes due to your history of polyps.  Further recommendations to follow.  At [facility] we value your feedback. You may receive a survey about your visit today. Please share your experience as we strive to create trusting relationships with our patients to provide genuine, compassionate, quality care.  We appreciate your understanding and patience as we review any laboratory studies, imaging, and other diagnostic tests that are ordered as we care for you. Our office policy is 5 business days for review of these results, and any emergent or urgent results are addressed in a timely manner for your best interest. If you do not hear from our office in 1 week, please contact us.   We also encourage the use of MyChart, which contains your medical information for your review as well. If you are not enrolled in this feature, an access code is on this after visit summary for your convenience. Thank you for allowing [place] to be involved in your care.  It was great to see you today!  I hope you have a great rest of your summer!!    Elon Alas. Abbey Chatters, D.O. Gastroenterology and Hepatology [facility]

## 2021-03-27 NOTE — Telephone Encounter (Signed)
PA for TCS submitted via [facility] website. PA# [id number], valid 05/14/21-08/12/21.

## 2021-04-01 ENCOUNTER — Other Ambulatory Visit: Payer: Self-pay | Admitting: Family Medicine

## 2021-05-14 ENCOUNTER — Ambulatory Visit (HOSPITAL_COMMUNITY): Payer: 59 | Admitting: Anesthesiology

## 2021-05-14 ENCOUNTER — Encounter (HOSPITAL_COMMUNITY): Payer: Self-pay

## 2021-05-14 ENCOUNTER — Encounter (HOSPITAL_COMMUNITY)
Admission: RE | Disposition: A | Discharge status: Home / Self Care | Payer: Self-pay | Source: Home / Self Care | Attending: Internal Medicine

## 2021-05-14 ENCOUNTER — Ambulatory Visit (HOSPITAL_COMMUNITY)
Admission: RE | Admit: 2021-05-14 | Discharge: 2021-05-14 | Disposition: A | Discharge status: Home / Self Care | Payer: 59 | Attending: Internal Medicine | Admitting: Internal Medicine

## 2021-05-14 ENCOUNTER — Other Ambulatory Visit: Payer: Self-pay

## 2021-05-14 DIAGNOSIS — Z79899 Other long term (current) drug therapy: Secondary | ICD-10-CM | POA: Diagnosis not present

## 2021-05-14 DIAGNOSIS — Z86010 Personal history of colonic polyps: Secondary | ICD-10-CM | POA: Diagnosis not present

## 2021-05-14 DIAGNOSIS — Z1211 Encounter for screening for malignant neoplasm of colon: Secondary | ICD-10-CM | POA: Insufficient documentation

## 2021-05-14 DIAGNOSIS — K648 Other hemorrhoids: Secondary | ICD-10-CM | POA: Insufficient documentation

## 2021-05-14 HISTORY — PX: COLONOSCOPY WITH PROPOFOL: SHX5780

## 2021-05-14 SURGERY — COLONOSCOPY WITH PROPOFOL
Anesthesia: General

## 2021-05-14 MED ORDER — PROPOFOL 10 MG/ML IV BOLUS
INTRAVENOUS | Status: DC | PRN
  Administered 2021-05-14: 100 mg via INTRAVENOUS

## 2021-05-14 MED ORDER — PROPOFOL 500 MG/50ML IV EMUL
INTRAVENOUS | Status: DC | PRN
  Administered 2021-05-14: 150 ug/kg/min via INTRAVENOUS

## 2021-05-14 MED ORDER — LACTATED RINGERS IV SOLN: INTRAVENOUS | Status: DC

## 2021-05-14 NOTE — Anesthesia Preprocedure Evaluation (Signed)
Anesthesia Evaluation  Patient identified by MRN, date of birth, ID band Patient awake    Reviewed: Allergy & Precautions, NPO status , Patient's Chart, lab work & pertinent test results  Airway Mallampati: I  TM Distance: >3 FB Neck ROM: Full    Dental no notable dental hx.    Pulmonary neg pulmonary ROS,    Pulmonary exam normal breath sounds clear to auscultation       Cardiovascular negative cardio ROS Normal cardiovascular exam Rhythm:Regular Rate:Normal     Neuro/Psych negative neurological ROS  negative psych ROS   GI/Hepatic negative GI ROS, Gilbert's syndrome   Endo/Other  negative endocrine ROS  Renal/GU Calculus of ureter  negative genitourinary   Musculoskeletal negative musculoskeletal ROS (+)   Abdominal   Peds negative pediatric ROS (+)  Hematology negative hematology ROS (+)   Anesthesia Other Findings   Reproductive/Obstetrics negative OB ROS                             Anesthesia Physical Anesthesia Plan  ASA: 2  Anesthesia Plan: General   Post-op Pain Management:    Induction: Intravenous  PONV Risk Score and Plan: TIVA  Airway Management Planned: Nasal Cannula, Simple Face Mask and Natural Airway  Additional Equipment:   Intra-op Plan:   Post-operative Plan:   Informed Consent: I have reviewed the patients History and Physical, chart, labs and discussed the procedure including the risks, benefits and alternatives for the proposed anesthesia with the patient or authorized representative who has indicated his/her understanding and acceptance.       Plan Discussed with: CRNA  Anesthesia Plan Comments:         Anesthesia Quick Evaluation

## 2021-05-14 NOTE — Discharge Instructions (Addendum)
  Colonoscopy Discharge Instructions  Read the instructions outlined below and refer to this sheet in the next few weeks. These discharge instructions provide you with general information on caring for yourself after you leave the hospital. Your doctor may also give you specific instructions. While your treatment has been planned according to the most current medical practices available, unavoidable complications occasionally occur.   ACTIVITY You may resume your regular activity, but move at a slower pace for the next 24 hours.  Take frequent rest periods for the next 24 hours.  Walking will help get rid of the air and reduce the bloated feeling in your belly (abdomen).  No driving for 24 hours (because of the medicine (anesthesia) used during the test).   Do not sign any important legal documents or operate any machinery for 24 hours (because of the anesthesia used during the test).  NUTRITION Drink plenty of fluids.  You may resume your normal diet as instructed by your doctor.  Begin with a light meal and progress to your normal diet. Heavy or fried foods are harder to digest and may make you feel sick to your stomach (nauseated).  Avoid alcoholic beverages for 24 hours or as instructed.  MEDICATIONS You may resume your normal medications unless your doctor tells you otherwise.  WHAT YOU CAN EXPECT TODAY Some feelings of bloating in the abdomen.  Passage of more gas than usual.  Spotting of blood in your stool or on the toilet paper.  IF YOU HAD POLYPS REMOVED DURING THE COLONOSCOPY: No aspirin products for 7 days or as instructed.  No alcohol for 7 days or as instructed.  Eat a soft diet for the next 24 hours.  FINDING OUT THE RESULTS OF YOUR TEST Not all test results are available during your visit. If your test results are not back during the visit, make an appointment with your caregiver to find out the results. Do not assume everything is normal if you have not heard from your  caregiver or the medical facility. It is important for you to follow up on all of your test results.  SEEK IMMEDIATE MEDICAL ATTENTION IF: You have more than a spotting of blood in your stool.  Your belly is swollen (abdominal distention).  You are nauseated or vomiting.  You have a temperature over 101.  You have abdominal pain or discomfort that is severe or gets worse throughout the day.   Your colonoscopy was relatively unremarkable.  I did not find any polyps or evidence of colon cancer.  I recommend repeating colonoscopy in 5 years given your history of multiple polyps in the past.  Otherwise follow-up with GI as needed.  I hope you have a great rest of your week!  Elon Alas. Abbey Chatters, D.O. Gastroenterology and Hepatology [facility]

## 2021-05-14 NOTE — H&P (Signed)
Primary Care Physician:  Eulogio Bear, NP Primary Gastroenterologist:  Dr. Abbey Chatters  Pre-Procedure History & Physical: HPI:  Devin Gross is a 55 y.o. male is here for a colonoscopy for surveillance due to personal history of adenomatous colon polyps. Last colonoscopy 2019 with 4 polyps removed, 3 of which were tubular adenomas.  No family history of colorectal malignancy.  No unintentional weight loss.  No melena hematochezia.  No complaints for me today.  Patient is healthy only takes atorvastatin for dyslipidemia.  Past Medical History:  Diagnosis Date   Acute non-recurrent pansinusitis 10/15/2020   Calculus of ureter 08/31/2013   Gilbert's syndrome    Hypercholesteremia     Past Surgical History:  Procedure Laterality Date   COLONOSCOPY N/A 12/28/2017   Procedure: COLONOSCOPY;  Surgeon: Danie Binder, MD;  Location: [facility];  Service: Endoscopy;  Laterality: N/A;  12:00pm   EYE SURGERY      Prior to Admission medications   Medication Sig Start Date End Date Taking? Authorizing Provider  atorvastatin (LIPITOR) 20 MG tablet TAKE 1 TABLET BY MOUTH AT BEDTIME. Patient taking differently: Take 20 mg by mouth daily. 04/01/21  Yes Eulogio Bear, NP  acetaminophen (TYLENOL) 500 MG tablet Take 500 mg by mouth every 8 (eight) hours as needed for headache.    [provider]    Allergies as of 03/27/2021   (No Known Allergies)    Family History  Problem Relation Age of Onset   Heart disease Father 23       CABG no prior hx   Cancer Father        prostate cancer    Hypertension Brother    Colon polyps Brother        age 12   Hypertension Maternal Grandmother    Colon cancer Neg Hx     Social History   Socioeconomic History   Marital status: Single    Spouse name: Not on file   Number of children: Not on file   Years of education: Not on file   Highest education level: Not on file  Occupational History    Employer: [employer]   Tobacco Use   Smoking status: Never   Smokeless tobacco: Never  Vaping Use   Vaping Use: Never used  Substance and Sexual Activity   Alcohol use: No   Drug use: No   Sexual activity: Not on file  Other Topics Concern   Not on file  Social History Narrative   Not on file   Social Determinants of Health   Financial Resource Strain: Not on file  Food Insecurity: Not on file  Transportation Needs: Not on file  Physical Activity: Not on file  Stress: Not on file  Social Connections: Not on file  Intimate Partner Violence: Not on file    Review of Systems: See HPI, otherwise negative ROS  Physical Exam: Vital signs in last 24 hours: Temp:  [98.6 F (37 C)] 98.6 F (37 C) (08/09 0951) Pulse Rate:  [61] 61 (08/09 0951) Resp:  [16] 16 (08/09 0951) BP: (170)/(93) 170/93 (08/09 0951) SpO2:  [100 %] 100 % (08/09 0951)   General:   Alert,  Well-developed, well-nourished, pleasant and cooperative in NAD Head:  Normocephalic and atraumatic. Eyes:  Sclera clear, no icterus.   Conjunctiva pink. Ears:  Normal auditory acuity. Nose:  No deformity, discharge,  or lesions. Mouth:  No deformity or lesions, dentition normal. Neck:  Supple; no masses or thyromegaly.  Lungs:  Clear throughout to auscultation.   No wheezes, crackles, or rhonchi. No acute distress. Heart:  Regular rate and rhythm; no murmurs, clicks, rubs,  or gallops. Abdomen:  Soft, nontender and nondistended. No masses, hepatosplenomegaly or hernias noted. Normal bowel sounds, without guarding, and without rebound.   Msk:  Symmetrical without gross deformities. Normal posture. Extremities:  Without clubbing or edema. Neurologic:  Alert and  oriented x4;  grossly normal neurologically. Skin:  Intact without significant lesions or rashes. Cervical Nodes:  No significant cervical adenopathy. Psych:  Alert and cooperative. Normal mood and affect.  Impression/Plan: Devin Gross is here for a colonoscopy for  surveillance due to personal history of adenomatous colon polyps.  The risks of the procedure including infection, bleed, or perforation as well as benefits, limitations, alternatives and imponderables have been reviewed with the patient. Questions have been answered. All parties agreeable.

## 2021-05-14 NOTE — Op Note (Signed)
[facility] Patient Name: Devin Gross Procedure Date: 05/14/2021 11:01 AM MRN: [id number] Date of Birth: June 05, 1966 Attending MD: Elon Alas. Abbey Chatters DO CSN: [id number] Age: 55 Admit Type: Outpatient Procedure:                Colonoscopy Indications:              Surveillance: Personal history of adenomatous                            polyps on last colonoscopy 3 years ago Providers:                Elon Alas. Abbey Chatters, DO, Gwenlyn Fudge, RN, Randa Spike, Technician Referring MD:              Medicines:                See the Anesthesia note for documentation of the                            administered medications Complications:            No immediate complications. Estimated Blood Loss:     Estimated blood loss: none. Procedure:                Pre-Anesthesia Assessment:                           - The anesthesia plan was to use monitored                            anesthesia care (MAC).                           After obtaining informed consent, the colonoscope                            was passed under direct vision. Throughout the                            procedure, the patient's blood pressure, pulse, and                            oxygen saturations were monitored continuously. The                            [id number]-HQ190L ([id number]) scope was introduced through                            the anus and advanced to the the cecum, identified                            by appendiceal orifice and ileocecal valve. The                            colonoscopy was performed without difficulty. The  patient tolerated the procedure well. The quality                            of the bowel preparation was evaluated using the                            BBPS [facility] Bowel Preparation Scale) with scores                            of: Right Colon = 3, Transverse Colon = 3 and Left                            Colon = 3 (entire mucosa  seen well with no residual                            staining, small fragments of stool or opaque                            liquid). The total BBPS score equals 9. Scope In: 11:11:32 AM Scope Out: 11:20:35 AM Scope Withdrawal Time: 0 hours 6 minutes 23 seconds  Total Procedure Duration: 0 hours 9 minutes 3 seconds  Findings:      The perianal and digital rectal examinations were normal.      Non-bleeding internal hemorrhoids were found during endoscopy.      The exam was otherwise without abnormality. Impression:               - Non-bleeding internal hemorrhoids.                           - The examination was otherwise normal.                           - No specimens collected. Moderate Sedation:      Per Anesthesia Care Recommendation:           - Patient has a contact number available for                            emergencies. The signs and symptoms of potential                            delayed complications were discussed with the                            patient. Return to normal activities tomorrow.                            Written discharge instructions were provided to the                            patient.                           - Continue present medications.                           -  Resume previous diet.                           - Repeat colonoscopy in 5 years for surveillance                            due to history of multiple polyps.                           - Return to GI clinic PRN. Procedure Code(s):        --- Professional ---                           U2725, Colorectal cancer screening; colonoscopy on                            individual at high risk Diagnosis Code(s):        --- Professional ---                           Z86.010, Personal history of colonic polyps                           K64.8, Other hemorrhoids CPT copyright 2019 American Medical Association. All rights reserved. The codes documented in this report are preliminary and upon  coder review may  be revised to meet current compliance requirements. Elon Alas. Abbey Chatters, DO [place] Abbey Chatters, DO 05/14/2021 11:24:12 AM This report has been signed electronically. Number of Addenda: 0

## 2021-05-14 NOTE — Anesthesia Postprocedure Evaluation (Signed)
Anesthesia Post Note  Patient: Devin Gross  Procedure(s) Performed: COLONOSCOPY WITH PROPOFOL  Anesthesia Type: General Anesthetic complications: no   No notable events documented.   Last Vitals:  Vitals:   05/14/21 0951 05/14/21 1123  BP: (!) 170/93 113/67  Pulse: 61   Resp: 16 13  Temp: 37 C 37.1 C  SpO2: 100% 98%    Last Pain:  Vitals:   05/14/21 1123  TempSrc: Oral  PainSc: 0-No pain                 Nicanor Alcon

## 2021-05-14 NOTE — Transfer of Care (Signed)
Immediate Anesthesia Transfer of Care Note  Patient: Devin Gross  Procedure(s) Performed: COLONOSCOPY WITH PROPOFOL  Patient Location: Endoscopy Unit  Anesthesia Type:General  Level of Consciousness: awake  Airway & Oxygen Therapy: Patient Spontanous Breathing  Post-op Assessment: Report given to RN and Post -op Vital signs reviewed and stable  Post vital signs: Reviewed and stable  Last Vitals:  Vitals Value Taken Time  BP    Temp    Pulse    Resp    SpO2      Last Pain:  Vitals:   05/14/21 1107  TempSrc:   PainSc: 5       Patients Stated Pain Goal: 10 ([phone] [id number])  Complications: No notable events documented.

## 2021-05-17 ENCOUNTER — Other Ambulatory Visit: Payer: Self-pay | Admitting: Family Medicine

## 2021-05-22 ENCOUNTER — Encounter (HOSPITAL_COMMUNITY): Payer: Self-pay | Admitting: Internal Medicine

## 2021-06-25 ENCOUNTER — Other Ambulatory Visit: Payer: Self-pay | Admitting: Family Medicine

## 2021-08-01 ENCOUNTER — Ambulatory Visit: Payer: 59 | Admitting: Nurse Practitioner

## 2021-08-01 ENCOUNTER — Other Ambulatory Visit: Payer: Self-pay

## 2021-08-01 ENCOUNTER — Encounter: Payer: Self-pay | Admitting: Nurse Practitioner

## 2021-08-01 VITALS — BP 128/82 | HR 62 | Temp 98.1°F | Resp 18 | Ht 73.0 in | Wt 165.0 lb

## 2021-08-01 DIAGNOSIS — E785 Hyperlipidemia, unspecified: Secondary | ICD-10-CM

## 2021-08-01 NOTE — Assessment & Plan Note (Signed)
Chronic.  He is tolerating atorvastatin 20 mg daily well.  Will check lipids with liver enzymes today.  History of Gilbert's syndrome and elevated serum total bilirubin.  LDL goal is less than 70 given strong family history of ASCVD.  Follow up in 6 months for physical.

## 2021-08-01 NOTE — Progress Notes (Signed)
Subjective:    Patient ID: Devin Gross, male    DOB: [birth year], 55 y.o.   MRN: [id number]  HPI: Devin Gross is a 54 y.o. male presenting for cholesterol follow up.  Chief Complaint  Patient presents with   Hyperlipidemia   HYPERLIPIDEMIA Currently taking atorvastatin 20 mg 5/7 days per week.  Sometimes he falls asleep before he can remember to take it.  He is fasting today. Hyperlipidemia status: stable Satisfied with current treatment?  yes Side effects:  no Medication compliance: good compliance Aspirin:  yes The 10-year ASCVD risk score (Arnett DK, et al., 2019) is: 5.9%   Values used to calculate the score:     Age: 89 years     Sex: Male     Is Non-Hispanic African American: Yes     Diabetic: No     Tobacco smoker: No     Systolic Blood Pressure: 387 mmHg     Is BP treated: No     HDL Cholesterol: 50 mg/dL     Total Cholesterol: 178 mg/dL Chest pain:  no Shortness of breath: no Myalgias: no Lower extremity swelling: no  No Known Allergies  Outpatient Encounter Medications as of 08/01/2021  Medication Sig   acetaminophen (TYLENOL) 500 MG tablet Take 500 mg by mouth every 8 (eight) hours as needed for headache.   atorvastatin (LIPITOR) 20 MG tablet TAKE 1 TABLET BY MOUTH AT BEDTIME.   No facility-administered encounter medications on file as of 08/01/2021.    Patient Active Problem List   Diagnosis Date Noted   Allergic rhinitis 04/02/2017   Prostate cancer screening 03/27/2016   Family history of premature coronary artery disease 11/10/2013   Hyperlipidemia 11/27/2012   Gilbert's syndrome     Past Medical History:  Diagnosis Date   Acute non-recurrent pansinusitis 10/15/2020   Calculus of ureter 08/31/2013   Gilbert's syndrome    Hypercholesteremia     Relevant past medical, surgical, family and social history reviewed and updated as indicated. Interim medical history since our last visit reviewed.  Review of Systems Per HPI unless  specifically indicated above     Objective:    BP 128/82 (BP Location: Left Arm, Patient Position: Sitting)   Pulse 62   Temp 98.1 F (36.7 C) (Temporal)   Resp 18   Ht 6\' 1"  (1.854 m)   Wt 165 lb (74.8 kg)   SpO2 97%   BMI 21.77 kg/m   Wt Readings from Last 3 Encounters:  08/01/21 165 lb (74.8 kg)  03/27/21 165 lb 12.8 oz (75.2 kg)  01/31/21 164 lb 12.8 oz (74.8 kg)    Physical Exam Vitals and nursing note reviewed.  Constitutional:      General: He is not in acute distress.    Appearance: Normal appearance. He is normal weight. He is not toxic-appearing.  Neck:     Vascular: No carotid bruit.  Cardiovascular:     Rate and Rhythm: Normal rate and regular rhythm.     Heart sounds: Normal heart sounds. No murmur heard. Pulmonary:     Effort: Pulmonary effort is normal. No respiratory distress.     Breath sounds: Normal breath sounds. No wheezing, rhonchi or rales.  Abdominal:     General: Abdomen is flat. Bowel sounds are normal. There is no distension.     Palpations: There is no mass.     Tenderness: There is no abdominal tenderness.  Musculoskeletal:     Right lower leg: No edema.  Left lower leg: No edema.  Skin:    General: Skin is warm.     Coloration: Skin is not jaundiced or pale.     Findings: No erythema.  Neurological:     Mental Status: He is alert and oriented to person, place, and time.     Motor: No weakness.     Gait: Gait normal.  Psychiatric:        Mood and Affect: Mood normal.        Behavior: Behavior normal.        Thought Content: Thought content normal.        Judgment: Judgment normal.      Assessment & Plan:   Problem List Items Addressed This Visit       Other   Hyperlipidemia - Primary    Chronic.  He is tolerating atorvastatin 20 mg daily well.  Will check lipids with liver enzymes today.  History of Gilbert's syndrome and elevated serum total bilirubin.  LDL goal is less than 70 given strong family history of ASCVD.  Follow  up in 6 months for physical.       Relevant Orders   Lipid panel   COMPLETE METABOLIC PANEL WITH GFR     Follow up plan: Return for after 4/28 for CPE with fasting labs.

## 2021-08-02 ENCOUNTER — Telehealth: Payer: Self-pay

## 2021-08-02 DIAGNOSIS — E785 Hyperlipidemia, unspecified: Secondary | ICD-10-CM

## 2021-08-02 LAB — COMPLETE METABOLIC PANEL WITH GFR
AG Ratio: 2.1 (calc) (ref 1.0–2.5)
ALT: 12 U/L (ref 9–46)
AST: 16 U/L (ref 10–35)
Albumin: 4.8 g/dL (ref 3.6–5.1)
Alkaline phosphatase (APISO): 62 U/L (ref 35–144)
BUN: 12 mg/dL (ref 7–25)
CO2: 26 mmol/L (ref 20–32)
Calcium: 9.7 mg/dL (ref 8.6–10.3)
Chloride: 104 mmol/L (ref 98–110)
Creat: 0.84 mg/dL (ref 0.70–1.30)
Globulin: 2.3 g/dL (calc) (ref 1.9–3.7)
Glucose, Bld: 86 mg/dL (ref 65–99)
Potassium: 4 mmol/L (ref 3.5–5.3)
Sodium: 139 mmol/L (ref 135–146)
Total Bilirubin: 2.8 mg/dL — ABNORMAL HIGH (ref 0.2–1.2)
Total Protein: 7.1 g/dL (ref 6.1–8.1)
eGFR: 104 mL/min/{1.73_m2} (ref >=60)

## 2021-08-02 LAB — LIPID PANEL
Cholesterol: 169 mg/dL (ref <200)
HDL: 52 mg/dL (ref >=40)
LDL Cholesterol (Calc): 96 mg/dL (calc)
Non-HDL Cholesterol (Calc): 117 mg/dL (calc) (ref <130)
Total CHOL/HDL Ratio: 3.3 (calc) (ref <5.0)
Triglycerides: 115 mg/dL (ref <150)

## 2021-08-02 MED ORDER — ATORVASTATIN CALCIUM 40 MG PO TABS: 40.0000 mg | ORAL_TABLET | Freq: Every day | ORAL | 3 refills | Status: DC

## 2021-08-02 NOTE — Telephone Encounter (Signed)
Spoke with patient regarding results and recommendations. Pt advised he can take 2/20mg  atorvastatin until he runs out of current supply, will then need to pick up new rx for 40mg . Patient voiced understanding. No further questions or concerns. Rx sent to pharmacy.

## 2021-08-02 NOTE — Telephone Encounter (Signed)
-----   Message from Eulogio Bear, NP sent at 08/02/2021  9:32 AM EDT ----- Please notify with lab results.  - Lipid panel has continued to improve, which is great. I would like to see LDL less than 70 because of his family history - his is currently at 89.  Please increase atorvastatin to 40 mg daily if he is agreeable.  Can plan to follow up as planned for physical. - Electrolytes, kidney function, and liver enzymes are all stable.

## 2021-08-02 NOTE — Telephone Encounter (Signed)
Attempted to reach pt re: lab results and recommendations. LVM for pt to return call.

## 2022-01-30 ENCOUNTER — Encounter: Payer: 59 | Admitting: Nurse Practitioner

## 2022-02-25 ENCOUNTER — Ambulatory Visit (INDEPENDENT_AMBULATORY_CARE_PROVIDER_SITE_OTHER): Payer: 59 | Admitting: Family Medicine

## 2022-02-25 VITALS — BP 118/60 | Temp 97.7°F | Ht 73.0 in | Wt 156.3 lb

## 2022-02-25 DIAGNOSIS — Z8249 Family history of ischemic heart disease and other diseases of the circulatory system: Secondary | ICD-10-CM

## 2022-02-25 DIAGNOSIS — R634 Abnormal weight loss: Secondary | ICD-10-CM

## 2022-02-25 DIAGNOSIS — Z0000 Encounter for general adult medical examination without abnormal findings: Secondary | ICD-10-CM | POA: Diagnosis not present

## 2022-02-25 DIAGNOSIS — Z125 Encounter for screening for malignant neoplasm of prostate: Secondary | ICD-10-CM | POA: Diagnosis not present

## 2022-02-25 DIAGNOSIS — E7800 Pure hypercholesterolemia, unspecified: Secondary | ICD-10-CM | POA: Diagnosis not present

## 2022-02-25 MED ORDER — FLUTICASONE PROPIONATE 50 MCG/ACT NA SUSP: 2.0000 | Freq: Every day | NASAL | 6 refills | Status: DC

## 2022-02-25 NOTE — Progress Notes (Signed)
Subjective:    Patient ID: Devin Gross, male    DOB: 12/09/1965, 56 y.o.   MRN: [id number]  HPI Patient is here today for complete physical exam.  He reports a 10 pound weight loss that is unintentional.  He denies any abdominal pain, melena, hematochezia, fevers or chills.  He denies any shortness of breath or chest pain.  He denies any cough.  He does have a history of elevated liver function test they were never fully discovered as to what was causing him.  He has a history of Gilbert's syndrome.  He had negative hepatitis screening in 2020.  He had a CAT scan in 2020 one of the abdomen pelvis that was normal.  He does have a left inguinal hernia however this is not bothering him and he declines surgery to correct it.  Otherwise he is doing well with no concerns. Last colonoscopy was 2022 (repeat in 5 years).  Immunization History  Administered Date(s) Administered   Influenza-Unspecified 07/05/2021   Moderna Sars-Covid-2 Vaccination 12/24/2019, 01/17/2020   PFIZER(Purple Top)SARS-COV-2 Vaccination 08/06/2020   Tdap 10/23/2012    Past Medical History:  Diagnosis Date   Acute non-recurrent pansinusitis 10/15/2020   Calculus of ureter 08/31/2013   Gilbert's syndrome    Hypercholesteremia    Past Surgical History:  Procedure Laterality Date   COLONOSCOPY N/A 12/28/2017   Procedure: COLONOSCOPY;  Surgeon: Danie Binder, MD;  [facility];  Service: Endoscopy;  Laterality: N/A;  12:00pm   COLONOSCOPY WITH PROPOFOL N/A 05/14/2021   Procedure: COLONOSCOPY WITH PROPOFOL;  Surgeon: Eloise Harman, DO;  Location: [facility];  Service: Endoscopy;  Laterality: N/A;  11:00am   EYE SURGERY     Current Outpatient Medications on File Prior to Visit  Medication Sig Dispense Refill   acetaminophen (TYLENOL) 500 MG tablet Take 500 mg by mouth every 8 (eight) hours as needed for headache.     atorvastatin (LIPITOR) 40 MG tablet Take 1 tablet (40 mg total) by mouth daily. 90  tablet 3   No current facility-administered medications on file prior to visit.   No Known Allergies Social History   Socioeconomic History   Marital status: Single    Spouse name: Not on file   Number of children: Not on file   Years of education: Not on file   Highest education level: Not on file  Occupational History    Employer: [employer]  Tobacco Use   Smoking status: Never   Smokeless tobacco: Never  Vaping Use   Vaping Use: Never used  Substance and Sexual Activity   Alcohol use: No   Drug use: No   Sexual activity: Not on file  Other Topics Concern   Not on file  Social History Narrative   Not on file   Social Determinants of Health   Financial Resource Strain: Not on file  Food Insecurity: Not on file  Transportation Needs: Not on file  Physical Activity: Not on file  Stress: Not on file  Social Connections: Not on file  Intimate Partner Violence: Not on file   Family History  Problem Relation Age of Onset   Heart disease Father 81       CABG no prior hx   Cancer Father        prostate cancer    Hypertension Brother    Colon polyps Brother        age 54   Hypertension Maternal Grandmother    Colon  cancer Neg Hx      Review of Systems  All other systems reviewed and are negative.     Objective:   Physical Exam Vitals reviewed.  Constitutional:      General: He is not in acute distress.    Appearance: Normal appearance. He is normal weight. He is not ill-appearing, toxic-appearing or diaphoretic.  HENT:     Head: Normocephalic and atraumatic.     Right Ear: Tympanic membrane and ear canal normal.     Left Ear: Tympanic membrane and ear canal normal.     Mouth/Throat:     Mouth: Mucous membranes are moist.     Pharynx: No oropharyngeal exudate or posterior oropharyngeal erythema.  Eyes:     Extraocular Movements: Extraocular movements intact.     Conjunctiva/sclera: Conjunctivae normal.     Pupils: Pupils are equal, round, and  reactive to light.  Neck:     Vascular: No carotid bruit.  Cardiovascular:     Rate and Rhythm: Normal rate and regular rhythm.     Pulses: Normal pulses.     Heart sounds: Normal heart sounds. No murmur heard.   No friction rub. No gallop.  Pulmonary:     Effort: Pulmonary effort is normal. No respiratory distress.     Breath sounds: Normal breath sounds. No stridor. No wheezing, rhonchi or rales.  Abdominal:     General: Bowel sounds are normal. There is no distension.     Palpations: Abdomen is soft.     Tenderness: There is no abdominal tenderness. There is no guarding.     Hernia: A hernia is present. Hernia is present in the left inguinal area.  Genitourinary:    Penis: Normal.      Testes: Normal.  Musculoskeletal:     Cervical back: Normal range of motion and neck supple.  Lymphadenopathy:     Cervical: No cervical adenopathy.  Neurological:     General: No focal deficit present.     Mental Status: He is alert and oriented to person, place, and time. Mental status is at baseline.     Cranial Nerves: No cranial nerve deficit.     Sensory: No sensory deficit.     Motor: No weakness.     Coordination: Coordination normal.     Gait: Gait normal.          Assessment & Plan:  Pure hypercholesterolemia - Plan: CBC with Differential/Platelet, Lipid panel, COMPLETE METABOLIC PANEL WITH GFR  Prostate cancer screening - Plan: PSA  Family history of premature coronary artery disease  Annual physical exam  Gilbert's syndrome  Weight loss - Plan: TSH Patient's weight loss does not concern him.  He believes it because he skips meals.  He states that he often only eats 1 meal a day.  His colonoscopy is up-to-date.  The remainder of his review of systems is negative.  I will check a PSA to screen for prostate cancer.  Given the weight loss I will check a TSH.  I will check a CBC CMP and lipid panel.  Given his family history of premature coronary artery disease I like to keep  his LDL cholesterol below 100.  His blood pressure today is excellent.  His immunizations are up-to-date.  His liver function test are elevated, I will hold his statin and get a right upper quadrant ultrasound.

## 2022-02-26 LAB — LIPID PANEL
Cholesterol: 256 mg/dL — ABNORMAL HIGH (ref <200)
HDL: 71 mg/dL (ref >=40)
LDL Cholesterol (Calc): 165 mg/dL (calc) — ABNORMAL HIGH
Non-HDL Cholesterol (Calc): 185 mg/dL (calc) — ABNORMAL HIGH (ref <130)
Total CHOL/HDL Ratio: 3.6 (calc) (ref <5.0)
Triglycerides: 96 mg/dL (ref <150)

## 2022-02-26 LAB — COMPLETE METABOLIC PANEL WITH GFR
AG Ratio: 1.9 (calc) (ref 1.0–2.5)
ALT: 10 U/L (ref 9–46)
AST: 18 U/L (ref 10–35)
Albumin: 4.9 g/dL (ref 3.6–5.1)
Alkaline phosphatase (APISO): 54 U/L (ref 35–144)
BUN: 10 mg/dL (ref 7–25)
CO2: 25 mmol/L (ref 20–32)
Calcium: 9.7 mg/dL (ref 8.6–10.3)
Chloride: 102 mmol/L (ref 98–110)
Creat: 0.89 mg/dL (ref 0.70–1.30)
Globulin: 2.6 g/dL (calc) (ref 1.9–3.7)
Glucose, Bld: 86 mg/dL (ref 65–99)
Potassium: 4.4 mmol/L (ref 3.5–5.3)
Sodium: 139 mmol/L (ref 135–146)
Total Bilirubin: 2.9 mg/dL — ABNORMAL HIGH (ref 0.2–1.2)
Total Protein: 7.5 g/dL (ref 6.1–8.1)
eGFR: 102 mL/min/{1.73_m2} (ref >=60)

## 2022-02-26 LAB — CBC WITH DIFFERENTIAL/PLATELET
Absolute Monocytes: 392 cells/uL (ref 200–950)
Basophils Absolute: 29 cells/uL (ref 0–200)
Basophils Relative: 0.6 %
Eosinophils Absolute: 78 cells/uL (ref 15–500)
Eosinophils Relative: 1.6 %
HCT: 47.8 % (ref 38.5–50.0)
Hemoglobin: 15.7 g/dL (ref 13.2–17.1)
Lymphs Abs: 2087 cells/uL (ref 850–3900)
MCH: 28.3 pg (ref 27.0–33.0)
MCHC: 32.8 g/dL (ref 32.0–36.0)
MCV: 86.3 fL (ref 80.0–100.0)
MPV: 10.6 fL (ref 7.5–12.5)
Monocytes Relative: 8 %
Neutro Abs: 2313 cells/uL (ref 1500–7800)
Neutrophils Relative %: 47.2 %
Platelets: 234 10*3/uL (ref 140–400)
RBC: 5.54 10*6/uL (ref 4.20–5.80)
RDW: 12.7 % (ref 11.0–15.0)
Total Lymphocyte: 42.6 %
WBC: 4.9 10*3/uL (ref 3.8–10.8)

## 2022-02-26 LAB — PSA: PSA: 1.58 ng/mL (ref <=4.00)

## 2022-02-26 LAB — TSH: TSH: 2.18 mIU/L (ref 0.40–4.50)

## 2022-02-27 ENCOUNTER — Telehealth: Payer: Self-pay

## 2022-02-27 NOTE — Telephone Encounter (Signed)
I left a message for the patient to return my call.  Regarding lab results

## 2022-02-27 NOTE — Telephone Encounter (Signed)
-----   Message from Susy Frizzle, MD sent at 02/27/2022  7:04 AM EDT ----- Cholesterol is very high.  Has he been taking atorvastatin daily?

## 2022-06-05 ENCOUNTER — Other Ambulatory Visit: Payer: 59

## 2022-06-05 DIAGNOSIS — E7800 Pure hypercholesterolemia, unspecified: Secondary | ICD-10-CM

## 2022-06-06 LAB — LIPID PANEL
Cholesterol: 156 mg/dL (ref <200)
HDL: 59 mg/dL (ref >=40)
LDL Cholesterol (Calc): 83 mg/dL (calc)
Non-HDL Cholesterol (Calc): 97 mg/dL (calc) (ref <130)
Total CHOL/HDL Ratio: 2.6 (calc) (ref <5.0)
Triglycerides: 67 mg/dL (ref <150)

## 2022-08-25 ENCOUNTER — Other Ambulatory Visit: Payer: Self-pay | Admitting: Family Medicine

## 2022-08-25 DIAGNOSIS — E785 Hyperlipidemia, unspecified: Secondary | ICD-10-CM

## 2022-10-11 ENCOUNTER — Other Ambulatory Visit: Payer: Self-pay | Admitting: Family Medicine

## 2022-10-11 DIAGNOSIS — E785 Hyperlipidemia, unspecified: Secondary | ICD-10-CM

## 2022-10-13 NOTE — Telephone Encounter (Signed)
Requested medication (s) are due for refill today: yes  Requested medication (s) are on the active medication list: yes  Last refill:  08/25/22 #30 0 refills  Future visit scheduled: no   Notes to clinic:  last OV 02/25/22. No refills remain. Can patient get 3 month supply 1 refill?     Requested Prescriptions  Pending Prescriptions Disp Refills   atorvastatin (LIPITOR) 40 MG tablet [Pharmacy Med Name: ATORVASTATIN 40 MG TABLET] 30 tablet 0    Sig: TAKE (1) TABLET BY MOUTH ONCE DAILY.     Cardiovascular:  Antilipid - Statins Failed - 10/13/2022 12:07 PM      Failed - Valid encounter within last 12 months    Recent Outpatient Visits           7 months ago Pure hypercholesterolemia   [place], Cammie Mcgee, MD   1 year ago Hyperlipidemia, unspecified hyperlipidemia type   [place] Eulogio Bear, NP   1 year ago Annual physical exam   [place] Eulogio Bear, NP   1 year ago Elevated liver function tests   [place] Eulogio Bear, NP   1 year ago Acute non-recurrent pansinusitis   [place] A, NP              Failed - Lipid Panel in normal range within the last 12 months    Cholesterol  Date Value Ref Range Status  06/05/2022 156 <200 mg/dL Final   LDL Cholesterol (Calc)  Date Value Ref Range Status  06/05/2022 83 mg/dL (calc) Final    Comment:    Reference range: <100 . Desirable range <100 mg/dL for primary prevention;   <70 mg/dL for patients with CHD or diabetic patients  with > or = 2 CHD risk factors. Marland Kitchen LDL-C is now calculated using the Martin-Hopkins  calculation, which is a validated novel method providing  better accuracy than the Friedewald equation in the  estimation of LDL-C.  Cresenciano Genre et al. Annamaria Helling. 0177;939(03): 2061-2068  (http://education.[facility].com/faq/FAQ164)    HDL  Date Value Ref Range Status   06/05/2022 59 > OR = 40 mg/dL Final   Triglycerides  Date Value Ref Range Status  06/05/2022 67 <150 mg/dL Final         Passed - Patient is not pregnant

## 2022-11-24 ENCOUNTER — Other Ambulatory Visit: Payer: Self-pay | Admitting: Family Medicine

## 2022-11-24 DIAGNOSIS — E785 Hyperlipidemia, unspecified: Secondary | ICD-10-CM

## 2022-11-25 ENCOUNTER — Telehealth: Payer: Self-pay | Admitting: Family Medicine

## 2022-11-25 ENCOUNTER — Other Ambulatory Visit: Payer: Self-pay

## 2022-11-25 DIAGNOSIS — E785 Hyperlipidemia, unspecified: Secondary | ICD-10-CM

## 2022-11-25 MED ORDER — ATORVASTATIN CALCIUM 40 MG PO TABS: ORAL_TABLET | ORAL | 1 refills | Status: DC

## 2022-11-25 NOTE — Telephone Encounter (Signed)
Patient called to follow up on pharmacy's refill request for  atorvastatin (LIPITOR) 40 MG tablet   Pharmacy confirmed as:  [facility] [address] [place], [place] [address] Phone: [phone]  Fax: [phone]   Patient only has enough for today's dose; requesting for refill to be sent in asap.  Please advise at [phone].

## 2023-02-17 ENCOUNTER — Ambulatory Visit: Payer: 59 | Admitting: Family Medicine

## 2023-02-17 ENCOUNTER — Ambulatory Visit (HOSPITAL_COMMUNITY)
Admission: RE | Admit: 2023-02-17 | Discharge: 2023-02-17 | Disposition: A | Discharge status: Home / Self Care | Payer: 59 | Source: Ambulatory Visit | Attending: Family Medicine | Admitting: Family Medicine

## 2023-02-17 ENCOUNTER — Encounter: Payer: Self-pay | Admitting: Family Medicine

## 2023-02-17 VITALS — BP 122/78 | HR 62 | Temp 98.4°F | Ht 73.0 in | Wt 153.0 lb

## 2023-02-17 DIAGNOSIS — R053 Chronic cough: Secondary | ICD-10-CM

## 2023-02-17 MED ORDER — PREDNISONE 20 MG PO TABS: ORAL_TABLET | ORAL | 0 refills | Status: DC

## 2023-02-17 NOTE — Progress Notes (Signed)
Subjective:    Patient ID: Devin Gross, male    DOB: October 13, 1965, 57 y.o.   MRN: [id number]  Cough  Patient states he has been dealing with a cough for 2 months.  He reports it is a tickle cough in his chest.  He feels like he is having drainage from his sinuses.  He has a history of frequent sinus infections.  He has been using Flonase without benefit.  He went to an urgent care at the end of April and was given doxycycline for bronchitis.  He states that his cough improved significantly after that although he continues to have sinus drainage and postnasal drip and rhinorrhea unrelieved by Flonase.  Today on exam, he has a expiratory wheezing and diminished breath sounds bilaterally but otherwise his exam is remarkable only for swollen indurated nasal mucosa he denies any hemoptysis or fevers or chills   Past Medical History:  Diagnosis Date   Acute non-recurrent pansinusitis 10/15/2020   Calculus of ureter 08/31/2013   Gilbert's syndrome    Hypercholesteremia    Past Surgical History:  Procedure Laterality Date   COLONOSCOPY N/A 12/28/2017   Procedure: COLONOSCOPY;  Surgeon: West Bali, MD;  [facility];  Service: Endoscopy;  Laterality: N/A;  12:00pm   COLONOSCOPY WITH PROPOFOL N/A 05/14/2021   Procedure: COLONOSCOPY WITH PROPOFOL;  Surgeon: Lanelle Bal, DO;  [facility];  Service: Endoscopy;  Laterality: N/A;  11:00am   EYE SURGERY     Current Outpatient Medications on File Prior to Visit  Medication Sig Dispense Refill   acetaminophen (TYLENOL) 500 MG tablet Take 500 mg by mouth every 8 (eight) hours as needed for headache.     atorvastatin (LIPITOR) 40 MG tablet TAKE (1) TABLET BY MOUTH ONCE DAILY. 90 tablet 1   fluticasone (FLONASE) 50 MCG/ACT nasal spray Place 2 sprays into both nostrils daily. 16 g 6   No current facility-administered medications on file prior to visit.   No Known Allergies Social History   Socioeconomic History    Marital status: Single    Spouse name: Not on file   Number of children: Not on file   Years of education: Not on file   Highest education level: Not on file  Occupational History    Employer: [employer]  Tobacco Use   Smoking status: Never   Smokeless tobacco: Never  Vaping Use   Vaping Use: Never used  Substance and Sexual Activity   Alcohol use: No   Drug use: No   Sexual activity: Not on file  Other Topics Concern   Not on file  Social History Narrative   Not on file   Social Determinants of Health   Financial Resource Strain: Low Risk  (11/10/2018)   Overall Financial Resource Strain (CARDIA)    Difficulty of Paying Living Expenses: Not hard at all  Food Insecurity: No Food Insecurity (11/10/2018)   Hunger Vital Sign    Worried About Running Out of Food in the Last Year: Never true    Ran Out of Food in the Last Year: Never true  Transportation Needs: No Transportation Needs (11/10/2018)   PRAPARE - Administrator, Civil Service (Medical): No    Lack of Transportation (Non-Medical): No  Physical Activity: Sufficiently Active (11/10/2018)   Exercise Vital Sign    Days of Exercise per Week: 5 days    Minutes of Exercise per Session: 50 min  Stress: Not on file  Social Connections: Somewhat Isolated (11/10/2018)   Social Connection and Isolation Panel [NHANES]    Frequency of Communication with Friends and Family: More than three times a week    Frequency of Social Gatherings with Friends and Family: More than three times a week    Attends Religious Services: Never    Database administrator or Organizations: No    Attends Banker Meetings: Never    Marital Status: Married  Catering manager Violence: Not At Risk (11/10/2018)   Humiliation, Afraid, Rape, and Kick questionnaire    Fear of Current or Ex-Partner: No    Emotionally Abused: No    Physically Abused: No    Sexually Abused: No   Family History  Problem Relation Age of Onset   Heart  disease Father 61       CABG no prior hx   Cancer Father        prostate cancer    Hypertension Brother    Colon polyps Brother        age 40   Hypertension Maternal Grandmother    Colon cancer Neg Hx      Review of Systems  Respiratory:  Positive for cough.   All other systems reviewed and are negative.      Objective:   Physical Exam Vitals reviewed.  Constitutional:      General: He is not in acute distress.    Appearance: Normal appearance. He is normal weight. He is not ill-appearing, toxic-appearing or diaphoretic.  HENT:     Head: Normocephalic and atraumatic.     Right Ear: Tympanic membrane and ear canal normal.     Left Ear: Tympanic membrane and ear canal normal.     Nose: Congestion and rhinorrhea present.     Right Turbinates: Swollen.     Left Turbinates: Swollen.     Right Sinus: No maxillary sinus tenderness or frontal sinus tenderness.     Left Sinus: No maxillary sinus tenderness or frontal sinus tenderness.     Mouth/Throat:     Mouth: Mucous membranes are moist.     Pharynx: No oropharyngeal exudate or posterior oropharyngeal erythema.  Eyes:     Extraocular Movements: Extraocular movements intact.     Conjunctiva/sclera: Conjunctivae normal.     Pupils: Pupils are equal, round, and reactive to light.  Neck:     Vascular: No carotid bruit.  Cardiovascular:     Rate and Rhythm: Normal rate and regular rhythm.     Pulses: Normal pulses.     Heart sounds: Normal heart sounds. No murmur heard.    No friction rub. No gallop.  Pulmonary:     Effort: Pulmonary effort is normal. No respiratory distress.     Breath sounds: No stridor. Wheezing present. No rhonchi or rales.  Musculoskeletal:     Cervical back: Normal range of motion and neck supple.  Lymphadenopathy:     Cervical: No cervical adenopathy.  Neurological:     General: No focal deficit present.     Mental Status: He is alert and oriented to person, place, and time. Mental status is at  baseline.           Assessment & Plan:  Chronic cough - Plan: DG Chest 2 View I believe that the patient likely has rhinosinusitis triggered by allergies for the last 2 months that is now triggered reactive airway disease with bronchospasms.  Begin a prednisone taper pack and obtain a chest x-ray to evaluate further.  Given the lack of pain in his sinuses and fever, I do not see an indication for antibiotics.  He recently finished 10 days of doxycycline.  Consider adding an inhaled corticosteroid if persistent.  Obtain chest x-ray given the duration of the cough

## 2023-02-25 ENCOUNTER — Telehealth: Payer: Self-pay

## 2023-02-25 NOTE — Telephone Encounter (Signed)
Pt called in wanting to speak with nurse about chest x-ray results. Please advise  Cb#: [phone]

## 2023-03-24 ENCOUNTER — Ambulatory Visit (INDEPENDENT_AMBULATORY_CARE_PROVIDER_SITE_OTHER): Payer: 59 | Admitting: Family Medicine

## 2023-03-24 ENCOUNTER — Encounter: Payer: Self-pay | Admitting: Family Medicine

## 2023-03-24 VITALS — BP 118/62 | HR 54 | Temp 97.6°F | Ht 73.0 in | Wt 154.0 lb

## 2023-03-24 DIAGNOSIS — Z125 Encounter for screening for malignant neoplasm of prostate: Secondary | ICD-10-CM

## 2023-03-24 DIAGNOSIS — Z0001 Encounter for general adult medical examination with abnormal findings: Secondary | ICD-10-CM

## 2023-03-24 DIAGNOSIS — E7800 Pure hypercholesterolemia, unspecified: Secondary | ICD-10-CM

## 2023-03-24 DIAGNOSIS — Z0000 Encounter for general adult medical examination without abnormal findings: Secondary | ICD-10-CM

## 2023-03-24 LAB — COMPLETE METABOLIC PANEL WITH GFR
ALT: 17 U/L (ref 9–46)
CO2: 30 mmol/L (ref 20–32)
Calcium: 9.6 mg/dL (ref 8.6–10.3)
Chloride: 103 mmol/L (ref 98–110)
Creat: 0.82 mg/dL (ref 0.70–1.30)
Sodium: 141 mmol/L (ref 135–146)
Total Bilirubin: 2.5 mg/dL — ABNORMAL HIGH (ref 0.2–1.2)
eGFR: 104 mL/min/{1.73_m2} (ref >=60)

## 2023-03-24 LAB — LIPID PANEL: HDL: 61 mg/dL (ref >=40)

## 2023-03-24 LAB — CBC WITH DIFFERENTIAL/PLATELET
Lymphs Abs: 1589 cells/uL (ref 850–3900)
MCHC: 32.1 g/dL (ref 32.0–36.0)
MPV: 10.3 fL (ref 7.5–12.5)
Platelets: 254 10*3/uL (ref 140–400)
Total Lymphocyte: 35.3 %

## 2023-03-24 MED ORDER — TRIAMCINOLONE ACETONIDE 0.1 % EX CREA: 1.0000 | TOPICAL_CREAM | Freq: Two times a day (BID) | CUTANEOUS | 0 refills | Status: AC

## 2023-03-24 NOTE — Progress Notes (Signed)
Subjective:    Patient ID: Devin Gross, male    DOB: [birth year], 57 y.o.   MRN: [id number]  HPI Patient is here today for complete physical exam.  Patient's last colonoscopy was in 2022.  He is due again in 2027.  He is due for PSA to screen for prostate cancer today.  Otherwise he is doing well.  He currently takes Lipitor for his cholesterol.  He is due to monitor his cholesterol and he is fasting.  Reviewing his shot records show that he is due for shingles shot.  He believes he may have had this at a local pharmacy so he wants to check those records first.  Otherwise his preventative care is up-to-date.  His review of systems is negative.  He does have some itchy bug bites on his right shin.  I gave him triamcinolone cream for this.  He acquired these while weed eating Immunization History  Administered Date(s) Administered   Influenza-Unspecified 07/05/2021   Moderna Sars-Covid-2 Vaccination 12/24/2019, 01/17/2020   PFIZER(Purple Top)SARS-COV-2 Vaccination 08/06/2020   Tdap 10/23/2012    Past Medical History:  Diagnosis Date   Acute non-recurrent pansinusitis 10/15/2020   Calculus of ureter 08/31/2013   Gilbert's syndrome    Hypercholesteremia    Past Surgical History:  Procedure Laterality Date   COLONOSCOPY N/A 12/28/2017   Procedure: COLONOSCOPY;  Surgeon: West Bali, MD;  [facility];  Service: Endoscopy;  Laterality: N/A;  12:00pm   COLONOSCOPY WITH PROPOFOL N/A 05/14/2021   Procedure: COLONOSCOPY WITH PROPOFOL;  Surgeon: Lanelle Bal, DO;  [facility];  Service: Endoscopy;  Laterality: N/A;  11:00am   EYE SURGERY     Current Outpatient Medications on File Prior to Visit  Medication Sig Dispense Refill   acetaminophen (TYLENOL) 500 MG tablet Take 500 mg by mouth every 8 (eight) hours as needed for headache.     atorvastatin (LIPITOR) 40 MG tablet TAKE (1) TABLET BY MOUTH ONCE DAILY. 90 tablet 1   fluticasone (FLONASE) 50 MCG/ACT nasal  spray Place 2 sprays into both nostrils daily. 16 g 6   predniSONE (DELTASONE) 20 MG tablet 3 tabs poqday 1-2, 2 tabs poqday 3-4, 1 tab poqday 5-6 12 tablet 0   No current facility-administered medications on file prior to visit.   No Known Allergies Social History   Socioeconomic History   Marital status: Single    Spouse name: Not on file   Number of children: Not on file   Years of education: Not on file   Highest education level: Not on file  Occupational History    Employer: [employer]  Tobacco Use   Smoking status: Never   Smokeless tobacco: Never  Vaping Use   Vaping Use: Never used  Substance and Sexual Activity   Alcohol use: No   Drug use: No   Sexual activity: Not on file  Other Topics Concern   Not on file  Social History Narrative   Not on file   Social Determinants of Health   Financial Resource Strain: Low Risk  (11/10/2018)   Overall Financial Resource Strain (CARDIA)    Difficulty of Paying Living Expenses: Not hard at all  Food Insecurity: No Food Insecurity (11/10/2018)   Hunger Vital Sign    Worried About Running Out of Food in the Last Year: Never true    Ran Out of Food in the Last Year: Never true  Transportation Needs: No Transportation Needs (11/10/2018)   PRAPARE -  Administrator, Civil Service (Medical): No    Lack of Transportation (Non-Medical): No  Physical Activity: Sufficiently Active (11/10/2018)   Exercise Vital Sign    Days of Exercise per Week: 5 days    Minutes of Exercise per Session: 50 min  Stress: Not on file  Social Connections: Somewhat Isolated (11/10/2018)   Social Connection and Isolation Panel [NHANES]    Frequency of Communication with Friends and Family: More than three times a week    Frequency of Social Gatherings with Friends and Family: More than three times a week    Attends Religious Services: Never    Database administrator or Organizations: No    Attends Banker Meetings: Never     Marital Status: Married  Catering manager Violence: Not At Risk (11/10/2018)   Humiliation, Afraid, Rape, and Kick questionnaire    Fear of Current or Ex-Partner: No    Emotionally Abused: No    Physically Abused: No    Sexually Abused: No   Family History  Problem Relation Age of Onset   Heart disease Father 71       CABG no prior hx   Cancer Father        prostate cancer    Hypertension Brother    Colon polyps Brother        age 1   Hypertension Maternal Grandmother    Colon cancer Neg Hx      Review of Systems  All other systems reviewed and are negative.      Objective:   Physical Exam Vitals reviewed.  Constitutional:      General: He is not in acute distress.    Appearance: Normal appearance. He is normal weight. He is not ill-appearing, toxic-appearing or diaphoretic.  HENT:     Head: Normocephalic and atraumatic.     Right Ear: Tympanic membrane and ear canal normal.     Left Ear: Tympanic membrane and ear canal normal.     Mouth/Throat:     Mouth: Mucous membranes are moist.     Pharynx: No oropharyngeal exudate or posterior oropharyngeal erythema.  Eyes:     Extraocular Movements: Extraocular movements intact.     Conjunctiva/sclera: Conjunctivae normal.     Pupils: Pupils are equal, round, and reactive to light.  Neck:     Vascular: No carotid bruit.  Cardiovascular:     Rate and Rhythm: Normal rate and regular rhythm.     Pulses: Normal pulses.     Heart sounds: Normal heart sounds. No murmur heard.    No friction rub. No gallop.  Pulmonary:     Effort: Pulmonary effort is normal. No respiratory distress.     Breath sounds: Normal breath sounds. No stridor. No wheezing, rhonchi or rales.  Abdominal:     General: Bowel sounds are normal. There is no distension.     Palpations: Abdomen is soft.     Tenderness: There is no abdominal tenderness. There is no guarding.     Hernia: A hernia is present.  Musculoskeletal:     Cervical back: Normal range  of motion and neck supple.  Lymphadenopathy:     Cervical: No cervical adenopathy.  Neurological:     General: No focal deficit present.     Mental Status: He is alert and oriented to person, place, and time. Mental status is at baseline.     Cranial Nerves: No cranial nerve deficit.     Sensory: No sensory deficit.  Motor: No weakness.     Coordination: Coordination normal.     Gait: Gait normal.           Assessment & Plan:  Pure hypercholesterolemia - Plan: CBC with Differential/Platelet, Lipid panel, COMPLETE METABOLIC PANEL WITH GFR  Prostate cancer screening - Plan: PSA  General medical exam Patient's physical exam today is relatively normal blood pressure is excellent at 118/62.  Check CBC, CMP, lipid panel.  I like to see his LDL cholesterol less than 161.  Screen for prostate cancer with a PSA.  Colonoscopy is up-to-date.  Recommended shingles vaccine if he has not already had one.  He will check his records and then return if he has not had the shingles vaccine

## 2023-03-25 LAB — CBC WITH DIFFERENTIAL/PLATELET
Absolute Monocytes: 360 cells/uL (ref 200–950)
Basophils Absolute: 41 cells/uL (ref 0–200)
Basophils Relative: 0.9 %
Eosinophils Absolute: 72 cells/uL (ref 15–500)
Eosinophils Relative: 1.6 %
HCT: 43 % (ref 38.5–50.0)
Hemoglobin: 13.8 g/dL (ref 13.2–17.1)
MCH: 27.1 pg (ref 27.0–33.0)
MCV: 84.3 fL (ref 80.0–100.0)
Monocytes Relative: 8 %
Neutro Abs: 2439 cells/uL (ref 1500–7800)
Neutrophils Relative %: 54.2 %
RBC: 5.1 10*6/uL (ref 4.20–5.80)
RDW: 13 % (ref 11.0–15.0)
WBC: 4.5 10*3/uL (ref 3.8–10.8)

## 2023-03-25 LAB — LIPID PANEL
Cholesterol: 157 mg/dL (ref <200)
LDL Cholesterol (Calc): 82 mg/dL (calc)
Non-HDL Cholesterol (Calc): 96 mg/dL (calc) (ref <130)
Total CHOL/HDL Ratio: 2.6 (calc) (ref <5.0)
Triglycerides: 68 mg/dL (ref <150)

## 2023-03-25 LAB — PSA: PSA: 1.72 ng/mL (ref <=4.00)

## 2023-03-25 LAB — COMPLETE METABOLIC PANEL WITH GFR
AG Ratio: 2 (calc) (ref 1.0–2.5)
AST: 18 U/L (ref 10–35)
Albumin: 4.7 g/dL (ref 3.6–5.1)
Alkaline phosphatase (APISO): 60 U/L (ref 35–144)
BUN: 13 mg/dL (ref 7–25)
Globulin: 2.3 g/dL (calc) (ref 1.9–3.7)
Glucose, Bld: 81 mg/dL (ref 65–99)
Potassium: 4.1 mmol/L (ref 3.5–5.3)
Total Protein: 7 g/dL (ref 6.1–8.1)

## 2023-07-02 ENCOUNTER — Other Ambulatory Visit: Payer: Self-pay | Admitting: Family Medicine

## 2023-07-02 DIAGNOSIS — E785 Hyperlipidemia, unspecified: Secondary | ICD-10-CM

## 2023-07-02 NOTE — Telephone Encounter (Signed)
Requested by interface surescripts. Last OV 03/24/23 last labs 03/24/23. Future visit in 8 months  Requested Prescriptions  Pending Prescriptions Disp Refills   atorvastatin (LIPITOR) 40 MG tablet [Pharmacy Med Name: atorvastatin 40 mg tablet] 30 tablet 5    Sig: TAKE (1) TABLET BY MOUTH ONCE DAILY.     Cardiovascular:  Antilipid - Statins Failed - 07/02/2023  9:45 AM      Failed - Valid encounter within last 12 months    Recent Outpatient Visits           1 year ago Pure hypercholesterolemia   [facility] Barkey, Priscille Heidelberg, MD   1 year ago Hyperlipidemia, unspecified hyperlipidemia type   [facility] Valentino Nose, NP   2 years ago Annual physical exam   [facility] Valentino Nose, NP   2 years ago Elevated liver function tests   [facility] Valentino Nose, NP   2 years ago Acute non-recurrent pansinusitis   [facility] Valentino Nose, NP       Future Appointments             In 8 months Tanya Nones, Priscille Heidelberg, MD [facility]            Failed - Lipid Panel in normal range within the last 12 months    Cholesterol  Date Value Ref Range Status  03/24/2023 157 <200 mg/dL Final   LDL Cholesterol (Calc)  Date Value Ref Range Status  03/24/2023 82 mg/dL (calc) Final    Comment:    Reference range: <100 . Desirable range <100 mg/dL for primary prevention;   <70 mg/dL for patients with CHD or diabetic patients  with > or = 2 CHD risk factors. Marland Kitchen LDL-C is now calculated using the Martin-Hopkins  calculation, which is a validated novel method providing  better accuracy than the Friedewald equation in the  estimation of LDL-C.  Horald Pollen et al. Lenox Ahr. 2956;213(08): 2061-2068  (http://education.[facility].com/faq/FAQ164)    HDL  Date Value Ref Range Status  03/24/2023 61 > OR = 40 mg/dL Final   Triglycerides  Date Value Ref Range  Status  03/24/2023 68 <150 mg/dL Final         Passed - Patient is not pregnant

## 2023-08-11 ENCOUNTER — Ambulatory Visit: Payer: 59 | Admitting: Family Medicine

## 2023-08-11 VITALS — BP 130/86 | HR 56 | Ht 73.0 in | Wt 156.0 lb

## 2023-08-11 DIAGNOSIS — R079 Chest pain, unspecified: Secondary | ICD-10-CM | POA: Diagnosis not present

## 2023-08-11 NOTE — Progress Notes (Signed)
Subjective:    Patient ID: Devin Gross, male    DOB: 1966/06/16, 57 y.o.   MRN: [id number]  Chest Pain   Patient states that he can split firewood without any problems.  He denies chest pain with activity or with strenuous work.  While sitting in church, the patient developed a spasm like pain in the center of his chest.  He states that if he moved or twisted the pain would intensify.  If he sat completely still the pain went away.  He states that the pain was present all day long.  He denies any shortness of breath.  He denied any nausea or vomiting.  He denied any radiation of the pain down his left arm.  He took some Gas-X and the pain went away.  He denies any reflux.  EKG today shows normal sinus rhythm but bradycardia at 50 bpm.  There is no evidence of ischemia or infarction.  He has normal intervals and normal axis.   Past Medical History:  Diagnosis Date   Acute non-recurrent pansinusitis 10/15/2020   Calculus of ureter 08/31/2013   Gilbert's syndrome    Hypercholesteremia    Past Surgical History:  Procedure Laterality Date   COLONOSCOPY N/A 12/28/2017   Procedure: COLONOSCOPY;  Surgeon: West Bali, MD;  [facility];  Service: Endoscopy;  Laterality: N/A;  12:00pm   COLONOSCOPY WITH PROPOFOL N/A 05/14/2021   Procedure: COLONOSCOPY WITH PROPOFOL;  Surgeon: Lanelle Bal, DO;  [facility];  Service: Endoscopy;  Laterality: N/A;  11:00am   EYE SURGERY     Current Outpatient Medications on File Prior to Visit  Medication Sig Dispense Refill   atorvastatin (LIPITOR) 40 MG tablet TAKE (1) TABLET BY MOUTH ONCE DAILY. 30 tablet 5   triamcinolone cream (KENALOG) 0.1 % Apply 1 Application topically 2 (two) times daily. 30 g 0   No current facility-administered medications on file prior to visit.   No Known Allergies Social History   Socioeconomic History   Marital status: Single    Spouse name: Not on file   Number of children: Not on file    Years of education: Not on file   Highest education level: Not on file  Occupational History    Employer: [employer]  Tobacco Use   Smoking status: Never   Smokeless tobacco: Never  Vaping Use   Vaping status: Never Used  Substance and Sexual Activity   Alcohol use: No   Drug use: No   Sexual activity: Not on file  Other Topics Concern   Not on file  Social History Narrative   Not on file   Social Determinants of Health   Financial Resource Strain: Low Risk  (11/10/2018)   Overall Financial Resource Strain (CARDIA)    Difficulty of Paying Living Expenses: Not hard at all  Food Insecurity: No Food Insecurity (11/10/2018)   Hunger Vital Sign    Worried About Running Out of Food in the Last Year: Never true    Ran Out of Food in the Last Year: Never true  Transportation Needs: No Transportation Needs (11/10/2018)   PRAPARE - Administrator, Civil Service (Medical): No    Lack of Transportation (Non-Medical): No  Physical Activity: Sufficiently Active (11/10/2018)   Exercise Vital Sign    Days of Exercise per Week: 5 days    Minutes of Exercise per Session: 50 min  Stress: Not on file  Social Connections: Somewhat Isolated (11/10/2018)  Social Advertising account executive [NHANES]    Frequency of Communication with Friends and Family: More than three times a week    Frequency of Social Gatherings with Friends and Family: More than three times a week    Attends Religious Services: Never    Database administrator or Organizations: No    Attends Banker Meetings: Never    Marital Status: Married  Catering manager Violence: Not At Risk (11/10/2018)   Humiliation, Afraid, Rape, and Kick questionnaire    Fear of Current or Ex-Partner: No    Emotionally Abused: No    Physically Abused: No    Sexually Abused: No   Family History  Problem Relation Age of Onset   Heart disease Father 29       CABG no prior hx   Cancer Father        prostate cancer     Hypertension Brother    Colon polyps Brother        age 15   Hypertension Maternal Grandmother    Colon cancer Neg Hx      Review of Systems  Cardiovascular:  Positive for chest pain.  All other systems reviewed and are negative.      Objective:   Physical Exam Vitals reviewed.  Constitutional:      General: He is not in acute distress.    Appearance: Normal appearance. He is normal weight. He is not ill-appearing, toxic-appearing or diaphoretic.  HENT:     Head: Normocephalic and atraumatic.     Right Ear: Tympanic membrane and ear canal normal.     Left Ear: Tympanic membrane and ear canal normal.     Mouth/Throat:     Mouth: Mucous membranes are moist.     Pharynx: No oropharyngeal exudate or posterior oropharyngeal erythema.  Eyes:     Extraocular Movements: Extraocular movements intact.     Conjunctiva/sclera: Conjunctivae normal.     Pupils: Pupils are equal, round, and reactive to light.  Neck:     Vascular: No carotid bruit.  Cardiovascular:     Rate and Rhythm: Normal rate and regular rhythm.     Pulses: Normal pulses.     Heart sounds: Normal heart sounds. No murmur heard.    No friction rub. No gallop.  Pulmonary:     Effort: Pulmonary effort is normal. No respiratory distress.     Breath sounds: Normal breath sounds. No stridor. No wheezing, rhonchi or rales.  Abdominal:     General: Bowel sounds are normal. There is no distension.     Palpations: Abdomen is soft.     Tenderness: There is no abdominal tenderness. There is no guarding.  Neurological:     General: No focal deficit present.     Mental Status: He is alert and oriented to person, place, and time. Mental status is at baseline.     Cranial Nerves: No cranial nerve deficit.     Sensory: No sensory deficit.     Motor: No weakness.     Coordination: Coordination normal.     Gait: Gait normal.          Assessment & Plan:  Chest pain, unspecified type - Plan: EKG 12-Lead, CT CARDIAC SCORING  (SELF PAY ONLY) Chest pain does not sound cardiac in nature.  I believe the patient was likely either having muscle spasm in the chest wall or potentially esophageal spasm brought on by asked reflux.  Patient is now symptom-free.  However given  his age I would like to get a coronary artery calcium score to help risk stratify the patient.  If there is significant plaque in his coronary arteries, I will refer the patient to cardiology.  If his coronary arteries are clear, I feel we have ruled out a cardiac source of his chest pain.

## 2023-08-18 ENCOUNTER — Ambulatory Visit (HOSPITAL_COMMUNITY)
Admission: RE | Admit: 2023-08-18 | Discharge: 2023-08-18 | Disposition: A | Discharge status: Home / Self Care | Payer: 59 | Source: Ambulatory Visit | Attending: Family Medicine | Admitting: Family Medicine

## 2023-08-18 DIAGNOSIS — R079 Chest pain, unspecified: Secondary | ICD-10-CM | POA: Insufficient documentation

## 2023-08-20 ENCOUNTER — Telehealth: Payer: Self-pay | Admitting: Family Medicine

## 2023-08-20 NOTE — Telephone Encounter (Signed)
Patient would like to know the results of his heart test he would like a callback

## 2023-09-08 ENCOUNTER — Other Ambulatory Visit: Payer: Self-pay

## 2023-09-08 DIAGNOSIS — E785 Hyperlipidemia, unspecified: Secondary | ICD-10-CM

## 2023-09-08 DIAGNOSIS — Z8249 Family history of ischemic heart disease and other diseases of the circulatory system: Secondary | ICD-10-CM

## 2023-09-08 MED ORDER — ATORVASTATIN CALCIUM 80 MG PO TABS: 80.0000 mg | ORAL_TABLET | Freq: Every day | ORAL | 3 refills | Status: DC

## 2024-03-25 ENCOUNTER — Ambulatory Visit: Payer: 59 | Admitting: Family Medicine

## 2024-03-25 ENCOUNTER — Encounter: Payer: Self-pay | Admitting: Family Medicine

## 2024-03-25 VITALS — BP 119/87 | HR 48 | Temp 98.0°F | Ht 73.0 in | Wt 157.6 lb

## 2024-03-25 DIAGNOSIS — E785 Hyperlipidemia, unspecified: Secondary | ICD-10-CM | POA: Diagnosis not present

## 2024-03-25 DIAGNOSIS — Z23 Encounter for immunization: Secondary | ICD-10-CM | POA: Diagnosis not present

## 2024-03-25 DIAGNOSIS — Z0001 Encounter for general adult medical examination with abnormal findings: Secondary | ICD-10-CM | POA: Diagnosis not present

## 2024-03-25 DIAGNOSIS — Z0000 Encounter for general adult medical examination without abnormal findings: Secondary | ICD-10-CM

## 2024-03-25 NOTE — Addendum Note (Signed)
 Addended by: Meriam Stamp on: 03/25/2024 04:03 PM   Modules accepted: Orders

## 2024-03-25 NOTE — Progress Notes (Signed)
 Subjective:    Patient ID: Devin Gross, male    DOB: 12/01/1965, 58 y.o.   MRN: [id number]  Patient is here today for a physical exam.  He is due for the pneumonia vaccine.  He is also due for shingles shot.  He agrees to receive the pneumonia vaccine.  His colonoscopy was in 2022 is up-to-date.  He is due probably can screening today.  On his physical exam today there is a faint systolic murmur.  This is very soft but this is the first time that I heard it.  He denies any chest pain shortness of breath or dyspnea on exertion. Past Medical History:  Diagnosis Date   Acute non-recurrent pansinusitis 10/15/2020   Calculus of ureter 08/31/2013   Gilbert's syndrome    Hypercholesteremia    Past Surgical History:  Procedure Laterality Date   COLONOSCOPY N/A 12/28/2017   Procedure: COLONOSCOPY;  Surgeon: Alyce Jubilee, MD;  [facility];  Service: Endoscopy;  Laterality: N/A;  12:00pm   COLONOSCOPY WITH PROPOFOL  N/A 05/14/2021   Procedure: COLONOSCOPY WITH PROPOFOL ;  Surgeon: Vinetta Greening, DO;  [facility];  Service: Endoscopy;  Laterality: N/A;  11:00am   EYE SURGERY     Current Outpatient Medications on File Prior to Visit  Medication Sig Dispense Refill   atorvastatin  (LIPITOR) 80 MG tablet Take 1 tablet (80 mg total) by mouth daily. 90 tablet 3   triamcinolone  cream (KENALOG ) 0.1 % Apply 1 Application topically 2 (two) times daily. 30 g 0   No current facility-administered medications on file prior to visit.   No Known Allergies Social History   Socioeconomic History   Marital status: Single    Spouse name: Not on file   Number of children: Not on file   Years of education: Not on file   Highest education level: Not on file  Occupational History    Employer: [employer]  Tobacco Use   Smoking status: Never   Smokeless tobacco: Never  Vaping Use   Vaping status: Never Used  Substance and Sexual Activity   Alcohol use: No   Drug use:  No   Sexual activity: Not on file  Other Topics Concern   Not on file  Social History Narrative   Not on file   Social Drivers of Health   Financial Resource Strain: Low Risk  (11/10/2018)   Overall Financial Resource Strain (CARDIA)    Difficulty of Paying Living Expenses: Not hard at all  Food Insecurity: No Food Insecurity (11/10/2018)   Hunger Vital Sign    Worried About Running Out of Food in the Last Year: Never true    Ran Out of Food in the Last Year: Never true  Transportation Needs: No Transportation Needs (11/10/2018)   PRAPARE - Administrator, Civil Service (Medical): No    Lack of Transportation (Non-Medical): No  Physical Activity: Sufficiently Active (11/10/2018)   Exercise Vital Sign    Days of Exercise per Week: 5 days    Minutes of Exercise per Session: 50 min  Stress: Not on file  Social Connections: Somewhat Isolated (11/10/2018)   Social Connection and Isolation Panel    Frequency of Communication with Friends and Family: More than three times a week    Frequency of Social Gatherings with Friends and Family: More than three times a week    Attends Religious Services: Never    Database administrator or Organizations: No  Attends Banker Meetings: Never    Marital Status: Married  Catering manager Violence: Not At Risk (11/10/2018)   Humiliation, Afraid, Rape, and Kick questionnaire    Fear of Current or Ex-Partner: No    Emotionally Abused: No    Physically Abused: No    Sexually Abused: No   Family History  Problem Relation Age of Onset   Heart disease Father 62       CABG no prior hx   Cancer Father        prostate cancer    Hypertension Brother    Colon polyps Brother        age 72   Hypertension Maternal Grandmother    Colon cancer Neg Hx      Review of Systems  All other systems reviewed and are negative.      Objective:   Physical Exam Vitals reviewed.  Constitutional:      General: He is not in acute distress.     Appearance: Normal appearance. He is normal weight. He is not ill-appearing, toxic-appearing or diaphoretic.  HENT:     Head: Normocephalic and atraumatic.     Right Ear: Tympanic membrane and ear canal normal.     Left Ear: Tympanic membrane and ear canal normal.     Nose: No congestion or rhinorrhea.     Mouth/Throat:     Mouth: Mucous membranes are moist.     Pharynx: No oropharyngeal exudate or posterior oropharyngeal erythema.   Eyes:     Extraocular Movements: Extraocular movements intact.     Conjunctiva/sclera: Conjunctivae normal.     Pupils: Pupils are equal, round, and reactive to light.   Neck:     Vascular: No carotid bruit.   Cardiovascular:     Rate and Rhythm: Normal rate and regular rhythm.     Pulses: Normal pulses.     Heart sounds: Murmur heard.     No friction rub. No gallop.  Pulmonary:     Effort: Pulmonary effort is normal. No respiratory distress.     Breath sounds: Normal breath sounds. No stridor. No wheezing, rhonchi or rales.  Abdominal:     General: Bowel sounds are normal. There is no distension.     Palpations: Abdomen is soft. There is no mass.     Tenderness: There is no abdominal tenderness. There is no guarding or rebound.     Hernia: No hernia is present.   Musculoskeletal:     Right lower leg: No edema.     Left lower leg: No edema.   Skin:    Findings: No rash.   Neurological:     General: No focal deficit present.     Mental Status: He is alert and oriented to person, place, and time. Mental status is at baseline.     Cranial Nerves: No cranial nerve deficit.     Sensory: No sensory deficit.     Motor: No weakness.     Coordination: Coordination normal.     Gait: Gait normal.   Psychiatric:        Mood and Affect: Mood normal.        Behavior: Behavior normal.        Thought Content: Thought content normal.        Judgment: Judgment normal.           Assessment & Plan:  Hyperlipidemia, unspecified hyperlipidemia  type - Plan: CBC with Differential/Platelet, Lipid panel, Comprehensive metabolic panel with GFR, PSA  General medical exam - Plan: CBC with Differential/Platelet, Lipid panel, Comprehensive metabolic panel with GFR, PSA Physical exam today is normal.  The murmur is very soft.  We will monitor this clinically.  The patient received his Pneumovax 23 today.  Defers shingles at present.  I will do CBC CMP and fasting lipid panel.  I would like to keep his LDL cholesterol less than 161.  I will check a PSA to screen for prostate cancer.  Blood pressure today is well-controlled.  Patient can return for a shingles vaccine if he changes his mind.

## 2024-03-26 LAB — CBC WITH DIFFERENTIAL/PLATELET
Absolute Lymphocytes: 1786 {cells}/uL (ref 850–3900)
Absolute Monocytes: 466 {cells}/uL (ref 200–950)
Basophils Absolute: 37 {cells}/uL (ref 0–200)
Basophils Relative: 0.7 %
Eosinophils Absolute: 148 {cells}/uL (ref 15–500)
Eosinophils Relative: 2.8 %
HCT: 42.1 % (ref 38.5–50.0)
Hemoglobin: 13.6 g/dL (ref 13.2–17.1)
MCH: 28 pg (ref 27.0–33.0)
MCHC: 32.3 g/dL (ref 32.0–36.0)
MCV: 86.8 fL (ref 80.0–100.0)
MPV: 10.5 fL (ref 7.5–12.5)
Monocytes Relative: 8.8 %
Neutro Abs: 2862 {cells}/uL (ref 1500–7800)
Neutrophils Relative %: 54 %
Platelets: 201 10*3/uL (ref 140–400)
RBC: 4.85 10*6/uL (ref 4.20–5.80)
RDW: 12.7 % (ref 11.0–15.0)
Total Lymphocyte: 33.7 %
WBC: 5.3 10*3/uL (ref 3.8–10.8)

## 2024-03-26 LAB — COMPREHENSIVE METABOLIC PANEL WITH GFR
AG Ratio: 2.3 (calc) (ref 1.0–2.5)
ALT: 28 U/L (ref 9–46)
AST: 26 U/L (ref 10–35)
Albumin: 4.6 g/dL (ref 3.6–5.1)
Alkaline phosphatase (APISO): 58 U/L (ref 35–144)
BUN: 11 mg/dL (ref 7–25)
CO2: 25 mmol/L (ref 20–32)
Calcium: 9.2 mg/dL (ref 8.6–10.3)
Chloride: 106 mmol/L (ref 98–110)
Creat: 0.75 mg/dL (ref 0.70–1.30)
Globulin: 2 g/dL (ref 1.9–3.7)
Glucose, Bld: 87 mg/dL (ref 65–99)
Potassium: 3.8 mmol/L (ref 3.5–5.3)
Sodium: 141 mmol/L (ref 135–146)
Total Bilirubin: 2.2 mg/dL — ABNORMAL HIGH (ref 0.2–1.2)
Total Protein: 6.6 g/dL (ref 6.1–8.1)
eGFR: 106 mL/min/{1.73_m2} (ref >=60)

## 2024-03-26 LAB — LIPID PANEL
Cholesterol: 139 mg/dL (ref <200)
HDL: 57 mg/dL (ref >=40)
LDL Cholesterol (Calc): 65 mg/dL
Non-HDL Cholesterol (Calc): 82 mg/dL (ref <130)
Total CHOL/HDL Ratio: 2.4 (calc) (ref <5.0)
Triglycerides: 86 mg/dL (ref <150)

## 2024-03-26 LAB — PSA: PSA: 1.33 ng/mL (ref <=4.00)

## 2024-03-28 ENCOUNTER — Ambulatory Visit: Payer: Self-pay | Admitting: Family Medicine

## 2024-10-14 ENCOUNTER — Other Ambulatory Visit: Payer: Self-pay | Admitting: Family Medicine

## 2024-10-14 DIAGNOSIS — E785 Hyperlipidemia, unspecified: Secondary | ICD-10-CM

## 2024-10-17 ENCOUNTER — Other Ambulatory Visit: Payer: Self-pay | Admitting: Family Medicine

## 2024-10-17 DIAGNOSIS — Z8249 Family history of ischemic heart disease and other diseases of the circulatory system: Secondary | ICD-10-CM

## 2024-10-17 DIAGNOSIS — E785 Hyperlipidemia, unspecified: Secondary | ICD-10-CM

## 2025-03-30 ENCOUNTER — Encounter: Admitting: Family Medicine
